# Patient Record
Sex: Female | Born: 1975 | Race: Black or African American | Hispanic: No | Marital: Single | Smoking: Former smoker
Health system: Southern US, Community
[De-identification: ages and names within clinical notes are randomized; demographics above are authoritative.]

## PROBLEM LIST (undated history)

## (undated) DIAGNOSIS — I10 Essential (primary) hypertension: Secondary | ICD-10-CM

## (undated) DIAGNOSIS — G43909 Migraine, unspecified, not intractable, without status migrainosus: Secondary | ICD-10-CM

## (undated) DIAGNOSIS — N979 Female infertility, unspecified: Secondary | ICD-10-CM

## (undated) DIAGNOSIS — E559 Vitamin D deficiency, unspecified: Secondary | ICD-10-CM

## (undated) DIAGNOSIS — F32A Depression, unspecified: Secondary | ICD-10-CM

## (undated) DIAGNOSIS — K760 Fatty (change of) liver, not elsewhere classified: Secondary | ICD-10-CM

## (undated) DIAGNOSIS — F419 Anxiety disorder, unspecified: Secondary | ICD-10-CM

## (undated) DIAGNOSIS — R0602 Shortness of breath: Secondary | ICD-10-CM

## (undated) DIAGNOSIS — M2550 Pain in unspecified joint: Secondary | ICD-10-CM

## (undated) DIAGNOSIS — R0789 Other chest pain: Secondary | ICD-10-CM

## (undated) DIAGNOSIS — F325 Major depressive disorder, single episode, in full remission: Secondary | ICD-10-CM

## (undated) DIAGNOSIS — M7989 Other specified soft tissue disorders: Secondary | ICD-10-CM

## (undated) DIAGNOSIS — Z91018 Allergy to other foods: Secondary | ICD-10-CM

## (undated) DIAGNOSIS — K829 Disease of gallbladder, unspecified: Secondary | ICD-10-CM

## (undated) DIAGNOSIS — M549 Dorsalgia, unspecified: Secondary | ICD-10-CM

## (undated) DIAGNOSIS — E282 Polycystic ovarian syndrome: Secondary | ICD-10-CM

## (undated) HISTORY — DX: Allergy to other foods: Z91.018

## (undated) HISTORY — DX: Disease of gallbladder, unspecified: K82.9

## (undated) HISTORY — DX: Fatty (change of) liver, not elsewhere classified: K76.0

## (undated) HISTORY — DX: Shortness of breath: R06.02

## (undated) HISTORY — DX: Vitamin D deficiency, unspecified: E55.9

## (undated) HISTORY — DX: Other specified soft tissue disorders: M79.89

## (undated) HISTORY — DX: Depression, unspecified: F32.A

## (undated) HISTORY — DX: Pain in unspecified joint: M25.50

## (undated) HISTORY — DX: Other chest pain: R07.89

## (undated) HISTORY — DX: Female infertility, unspecified: N97.9

## (undated) HISTORY — DX: Migraine, unspecified, not intractable, without status migrainosus: G43.909

## (undated) HISTORY — DX: Anxiety disorder, unspecified: F41.9

## (undated) HISTORY — DX: Dorsalgia, unspecified: M54.9

---
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
## 1997-09-13 ENCOUNTER — Encounter: Admission: RE | Admit: 1997-09-13 | Discharge: 1997-09-13 | Payer: Self-pay | Admitting: Family Medicine

## 1997-09-28 ENCOUNTER — Encounter: Admission: RE | Admit: 1997-09-28 | Discharge: 1997-09-28 | Payer: Self-pay | Admitting: Family Medicine

## 1998-03-08 ENCOUNTER — Encounter: Admission: RE | Admit: 1998-03-08 | Discharge: 1998-03-08 | Payer: Self-pay | Admitting: Family Medicine

## 1998-04-02 ENCOUNTER — Encounter: Admission: RE | Admit: 1998-04-02 | Discharge: 1998-04-02 | Payer: Self-pay | Admitting: Family Medicine

## 1998-04-05 ENCOUNTER — Encounter: Admission: RE | Admit: 1998-04-05 | Discharge: 1998-04-05 | Payer: Self-pay | Admitting: Family Medicine

## 1998-04-09 ENCOUNTER — Other Ambulatory Visit: Admission: RE | Admit: 1998-04-09 | Discharge: 1998-04-09 | Payer: Self-pay | Admitting: Family Medicine

## 1998-04-19 ENCOUNTER — Encounter: Admission: RE | Admit: 1998-04-19 | Discharge: 1998-04-19 | Payer: Self-pay | Admitting: Family Medicine

## 1998-07-12 ENCOUNTER — Encounter: Admission: RE | Admit: 1998-07-12 | Discharge: 1998-07-12 | Payer: Self-pay | Admitting: Family Medicine

## 1998-08-30 ENCOUNTER — Encounter: Admission: RE | Admit: 1998-08-30 | Discharge: 1998-08-30 | Payer: Self-pay | Admitting: Family Medicine

## 1998-09-13 ENCOUNTER — Encounter: Admission: RE | Admit: 1998-09-13 | Discharge: 1998-09-13 | Payer: Self-pay | Admitting: Family Medicine

## 1998-10-09 ENCOUNTER — Encounter: Admission: RE | Admit: 1998-10-09 | Discharge: 1998-10-09 | Payer: Self-pay | Admitting: Family Medicine

## 1998-11-07 ENCOUNTER — Encounter: Admission: RE | Admit: 1998-11-07 | Discharge: 1998-11-07 | Payer: Self-pay | Admitting: Family Medicine

## 1999-04-23 ENCOUNTER — Encounter: Admission: RE | Admit: 1999-04-23 | Discharge: 1999-04-23 | Payer: Self-pay | Admitting: Family Medicine

## 1999-05-16 ENCOUNTER — Encounter: Admission: RE | Admit: 1999-05-16 | Discharge: 1999-05-16 | Payer: Self-pay | Admitting: Family Medicine

## 1999-06-27 ENCOUNTER — Encounter: Admission: RE | Admit: 1999-06-27 | Discharge: 1999-06-27 | Payer: Self-pay | Admitting: Family Medicine

## 1999-08-21 ENCOUNTER — Encounter: Admission: RE | Admit: 1999-08-21 | Discharge: 1999-08-21 | Payer: Self-pay | Admitting: Family Medicine

## 1999-10-29 ENCOUNTER — Encounter: Admission: RE | Admit: 1999-10-29 | Discharge: 1999-10-29 | Payer: Self-pay | Admitting: Family Medicine

## 2000-03-11 ENCOUNTER — Encounter: Admission: RE | Admit: 2000-03-11 | Discharge: 2000-03-11 | Payer: Self-pay | Admitting: Family Medicine

## 2000-03-17 ENCOUNTER — Encounter: Admission: RE | Admit: 2000-03-17 | Discharge: 2000-03-17 | Payer: Self-pay | Admitting: Family Medicine

## 2000-04-13 ENCOUNTER — Encounter: Admission: RE | Admit: 2000-04-13 | Discharge: 2000-04-13 | Payer: Self-pay | Admitting: Family Medicine

## 2000-05-19 ENCOUNTER — Encounter: Admission: RE | Admit: 2000-05-19 | Discharge: 2000-05-19 | Payer: Self-pay | Admitting: Family Medicine

## 2000-06-11 ENCOUNTER — Encounter: Admission: RE | Admit: 2000-06-11 | Discharge: 2000-06-11 | Payer: Self-pay | Admitting: Family Medicine

## 2000-06-30 ENCOUNTER — Encounter: Admission: RE | Admit: 2000-06-30 | Discharge: 2000-06-30 | Payer: Self-pay | Admitting: Family Medicine

## 2000-07-13 ENCOUNTER — Encounter: Admission: RE | Admit: 2000-07-13 | Discharge: 2000-07-13 | Payer: Self-pay | Admitting: Family Medicine

## 2000-07-29 ENCOUNTER — Encounter: Admission: RE | Admit: 2000-07-29 | Discharge: 2000-07-29 | Payer: Self-pay | Admitting: Family Medicine

## 2000-11-29 ENCOUNTER — Emergency Department (HOSPITAL_COMMUNITY): Admission: EM | Admit: 2000-11-29 | Discharge: 2000-11-29 | Payer: Self-pay

## 2000-11-29 ENCOUNTER — Encounter: Payer: Self-pay | Admitting: Emergency Medicine

## 2001-01-31 ENCOUNTER — Encounter: Admission: RE | Admit: 2001-01-31 | Discharge: 2001-01-31 | Payer: Self-pay | Admitting: Family Medicine

## 2001-03-02 ENCOUNTER — Encounter: Admission: RE | Admit: 2001-03-02 | Discharge: 2001-03-02 | Payer: Self-pay | Admitting: Family Medicine

## 2001-03-17 ENCOUNTER — Ambulatory Visit (HOSPITAL_BASED_OUTPATIENT_CLINIC_OR_DEPARTMENT_OTHER): Admission: RE | Admit: 2001-03-17 | Discharge: 2001-03-17 | Payer: Self-pay | Admitting: Family Medicine

## 2001-03-30 ENCOUNTER — Encounter: Admission: RE | Admit: 2001-03-30 | Discharge: 2001-03-30 | Payer: Self-pay | Admitting: Family Medicine

## 2001-03-30 ENCOUNTER — Encounter (INDEPENDENT_AMBULATORY_CARE_PROVIDER_SITE_OTHER): Payer: Self-pay | Admitting: *Deleted

## 2001-04-13 ENCOUNTER — Encounter: Admission: RE | Admit: 2001-04-13 | Discharge: 2001-04-13 | Payer: Self-pay | Admitting: Family Medicine

## 2001-04-29 ENCOUNTER — Encounter: Admission: RE | Admit: 2001-04-29 | Discharge: 2001-04-29 | Payer: Self-pay | Admitting: Family Medicine

## 2001-05-18 ENCOUNTER — Encounter: Admission: RE | Admit: 2001-05-18 | Discharge: 2001-05-18 | Payer: Self-pay | Admitting: Family Medicine

## 2001-05-31 ENCOUNTER — Encounter: Admission: RE | Admit: 2001-05-31 | Discharge: 2001-05-31 | Payer: Self-pay | Admitting: Family Medicine

## 2001-05-31 ENCOUNTER — Encounter: Payer: Self-pay | Admitting: Sports Medicine

## 2001-05-31 ENCOUNTER — Encounter: Admission: RE | Admit: 2001-05-31 | Discharge: 2001-05-31 | Payer: Self-pay | Admitting: Sports Medicine

## 2001-06-07 ENCOUNTER — Encounter: Admission: RE | Admit: 2001-06-07 | Discharge: 2001-06-07 | Payer: Self-pay | Admitting: Family Medicine

## 2001-06-08 ENCOUNTER — Encounter: Admission: RE | Admit: 2001-06-08 | Discharge: 2001-06-08 | Payer: Self-pay | Admitting: Family Medicine

## 2001-06-14 ENCOUNTER — Encounter: Admission: RE | Admit: 2001-06-14 | Discharge: 2001-06-14 | Payer: Self-pay | Admitting: Family Medicine

## 2001-06-22 ENCOUNTER — Encounter: Admission: RE | Admit: 2001-06-22 | Discharge: 2001-06-22 | Payer: Self-pay | Admitting: Family Medicine

## 2001-06-27 ENCOUNTER — Encounter: Admission: RE | Admit: 2001-06-27 | Discharge: 2001-07-22 | Payer: Self-pay | Admitting: Sports Medicine

## 2001-07-06 ENCOUNTER — Encounter: Admission: RE | Admit: 2001-07-06 | Discharge: 2001-07-06 | Payer: Self-pay | Admitting: Family Medicine

## 2001-12-05 ENCOUNTER — Encounter: Admission: RE | Admit: 2001-12-05 | Discharge: 2001-12-05 | Payer: Self-pay | Admitting: Family Medicine

## 2003-02-05 ENCOUNTER — Encounter: Admission: RE | Admit: 2003-02-05 | Discharge: 2003-02-05 | Payer: Self-pay | Admitting: Family Medicine

## 2003-02-21 ENCOUNTER — Encounter: Admission: RE | Admit: 2003-02-21 | Discharge: 2003-02-21 | Payer: Self-pay | Admitting: Family Medicine

## 2003-02-21 ENCOUNTER — Encounter (INDEPENDENT_AMBULATORY_CARE_PROVIDER_SITE_OTHER): Payer: Self-pay | Admitting: Family Medicine

## 2003-03-14 ENCOUNTER — Encounter: Admission: RE | Admit: 2003-03-14 | Discharge: 2003-03-14 | Payer: Self-pay | Admitting: Family Medicine

## 2004-05-10 ENCOUNTER — Encounter (INDEPENDENT_AMBULATORY_CARE_PROVIDER_SITE_OTHER): Payer: Self-pay | Admitting: *Deleted

## 2004-05-10 LAB — CONVERTED CEMR LAB

## 2004-05-14 ENCOUNTER — Ambulatory Visit: Payer: Self-pay | Admitting: Family Medicine

## 2004-05-14 ENCOUNTER — Encounter (INDEPENDENT_AMBULATORY_CARE_PROVIDER_SITE_OTHER): Payer: Self-pay | Admitting: Family Medicine

## 2005-01-05 HISTORY — PX: CHOLECYSTECTOMY: SHX55

## 2005-09-06 ENCOUNTER — Emergency Department (HOSPITAL_COMMUNITY): Admission: EM | Admit: 2005-09-06 | Discharge: 2005-09-07 | Payer: Self-pay | Admitting: Emergency Medicine

## 2005-09-16 ENCOUNTER — Encounter: Admission: RE | Admit: 2005-09-16 | Discharge: 2005-09-16 | Payer: Self-pay | Admitting: Sports Medicine

## 2005-09-24 ENCOUNTER — Emergency Department (HOSPITAL_COMMUNITY): Admission: EM | Admit: 2005-09-24 | Discharge: 2005-09-24 | Payer: Self-pay | Admitting: Emergency Medicine

## 2005-09-30 ENCOUNTER — Encounter (INDEPENDENT_AMBULATORY_CARE_PROVIDER_SITE_OTHER): Payer: Self-pay | Admitting: Specialist

## 2005-09-30 ENCOUNTER — Ambulatory Visit (HOSPITAL_COMMUNITY): Admission: RE | Admit: 2005-09-30 | Discharge: 2005-10-01 | Payer: Self-pay | Admitting: General Surgery

## 2006-03-04 DIAGNOSIS — E669 Obesity, unspecified: Secondary | ICD-10-CM | POA: Insufficient documentation

## 2006-03-04 DIAGNOSIS — F323 Major depressive disorder, single episode, severe with psychotic features: Secondary | ICD-10-CM

## 2006-03-04 DIAGNOSIS — E282 Polycystic ovarian syndrome: Secondary | ICD-10-CM | POA: Insufficient documentation

## 2006-03-04 DIAGNOSIS — L680 Hirsutism: Secondary | ICD-10-CM

## 2006-03-04 DIAGNOSIS — G5600 Carpal tunnel syndrome, unspecified upper limb: Secondary | ICD-10-CM | POA: Insufficient documentation

## 2006-03-05 ENCOUNTER — Encounter (INDEPENDENT_AMBULATORY_CARE_PROVIDER_SITE_OTHER): Payer: Self-pay | Admitting: *Deleted

## 2006-03-26 ENCOUNTER — Ambulatory Visit: Payer: Self-pay | Admitting: Family Medicine

## 2006-04-12 ENCOUNTER — Telehealth (INDEPENDENT_AMBULATORY_CARE_PROVIDER_SITE_OTHER): Payer: Self-pay | Admitting: Family Medicine

## 2006-04-14 ENCOUNTER — Telehealth: Payer: Self-pay | Admitting: *Deleted

## 2006-04-29 ENCOUNTER — Telehealth (INDEPENDENT_AMBULATORY_CARE_PROVIDER_SITE_OTHER): Payer: Self-pay | Admitting: *Deleted

## 2006-05-05 ENCOUNTER — Ambulatory Visit: Payer: Self-pay | Admitting: Family Medicine

## 2006-06-18 ENCOUNTER — Telehealth: Payer: Self-pay | Admitting: *Deleted

## 2006-09-29 ENCOUNTER — Telehealth: Payer: Self-pay | Admitting: *Deleted

## 2006-09-29 ENCOUNTER — Encounter: Payer: Self-pay | Admitting: Family Medicine

## 2006-09-29 ENCOUNTER — Ambulatory Visit: Payer: Self-pay | Admitting: Family Medicine

## 2006-09-29 LAB — CONVERTED CEMR LAB
Calcium: 9.4 mg/dL (ref 8.4–10.5)
Glucose, Bld: 106 mg/dL — ABNORMAL HIGH (ref 70–99)
Sodium: 139 meq/L (ref 135–145)
TSH: 1.719 microintl units/mL (ref 0.350–5.50)

## 2006-10-05 ENCOUNTER — Telehealth: Payer: Self-pay | Admitting: Family Medicine

## 2006-10-07 ENCOUNTER — Telehealth: Payer: Self-pay | Admitting: *Deleted

## 2006-10-11 ENCOUNTER — Other Ambulatory Visit (HOSPITAL_COMMUNITY): Admission: RE | Admit: 2006-10-11 | Discharge: 2006-10-22 | Payer: Self-pay | Admitting: Psychiatry

## 2006-10-26 ENCOUNTER — Encounter: Payer: Self-pay | Admitting: Family Medicine

## 2006-10-27 ENCOUNTER — Ambulatory Visit: Payer: Self-pay | Admitting: Psychiatry

## 2006-11-24 ENCOUNTER — Other Ambulatory Visit (HOSPITAL_COMMUNITY): Admission: RE | Admit: 2006-11-24 | Discharge: 2006-12-08 | Payer: Self-pay | Admitting: Psychiatry

## 2006-12-08 ENCOUNTER — Ambulatory Visit: Payer: Self-pay | Admitting: *Deleted

## 2006-12-08 ENCOUNTER — Inpatient Hospital Stay (HOSPITAL_COMMUNITY): Admission: RE | Admit: 2006-12-08 | Discharge: 2006-12-15 | Payer: Self-pay | Admitting: *Deleted

## 2006-12-16 ENCOUNTER — Other Ambulatory Visit (HOSPITAL_COMMUNITY): Admission: RE | Admit: 2006-12-16 | Discharge: 2007-01-10 | Payer: Self-pay | Admitting: Psychiatry

## 2006-12-17 ENCOUNTER — Telehealth: Payer: Self-pay | Admitting: *Deleted

## 2006-12-21 ENCOUNTER — Ambulatory Visit: Payer: Self-pay | Admitting: Family Medicine

## 2006-12-21 ENCOUNTER — Telehealth: Payer: Self-pay | Admitting: *Deleted

## 2006-12-21 ENCOUNTER — Encounter: Admission: RE | Admit: 2006-12-21 | Discharge: 2006-12-21 | Payer: Self-pay | Admitting: Family Medicine

## 2007-01-11 ENCOUNTER — Encounter: Payer: Self-pay | Admitting: Family Medicine

## 2007-01-15 ENCOUNTER — Emergency Department (HOSPITAL_COMMUNITY): Admission: EM | Admit: 2007-01-15 | Discharge: 2007-01-15 | Payer: Self-pay | Admitting: Emergency Medicine

## 2007-01-15 ENCOUNTER — Inpatient Hospital Stay (HOSPITAL_COMMUNITY): Admission: AD | Admit: 2007-01-15 | Discharge: 2007-01-19 | Payer: Self-pay | Admitting: Psychiatry

## 2007-01-17 ENCOUNTER — Ambulatory Visit: Payer: Self-pay | Admitting: Psychiatry

## 2007-01-20 ENCOUNTER — Other Ambulatory Visit (HOSPITAL_COMMUNITY): Admission: RE | Admit: 2007-01-20 | Discharge: 2007-04-20 | Payer: Self-pay | Admitting: Psychiatry

## 2007-03-27 ENCOUNTER — Emergency Department (HOSPITAL_COMMUNITY): Admission: EM | Admit: 2007-03-27 | Discharge: 2007-03-27 | Payer: Self-pay | Admitting: Emergency Medicine

## 2007-03-28 ENCOUNTER — Telehealth: Payer: Self-pay | Admitting: *Deleted

## 2007-03-31 ENCOUNTER — Encounter (INDEPENDENT_AMBULATORY_CARE_PROVIDER_SITE_OTHER): Payer: Self-pay | Admitting: Family Medicine

## 2007-03-31 ENCOUNTER — Ambulatory Visit: Payer: Self-pay | Admitting: Family Medicine

## 2007-03-31 DIAGNOSIS — G4733 Obstructive sleep apnea (adult) (pediatric): Secondary | ICD-10-CM | POA: Insufficient documentation

## 2007-03-31 DIAGNOSIS — I10 Essential (primary) hypertension: Secondary | ICD-10-CM

## 2007-03-31 LAB — CONVERTED CEMR LAB
ALT: 24 units/L (ref 0–35)
AST: 21 units/L (ref 0–37)
Albumin: 4.1 g/dL (ref 3.5–5.2)
Alkaline Phosphatase: 65 units/L (ref 39–117)
Glucose, Bld: 80 mg/dL (ref 70–99)
MCHC: 32 g/dL (ref 30.0–36.0)
Platelets: 398 10*3/uL (ref 150–400)
Potassium: 4 meq/L (ref 3.5–5.3)
RBC: 4.61 M/uL (ref 3.87–5.11)
Sodium: 138 meq/L (ref 135–145)
Total Protein: 7.5 g/dL (ref 6.0–8.3)

## 2007-04-28 ENCOUNTER — Ambulatory Visit: Payer: Self-pay | Admitting: Family Medicine

## 2007-05-13 ENCOUNTER — Ambulatory Visit: Payer: Self-pay | Admitting: Family Medicine

## 2007-05-13 ENCOUNTER — Telehealth: Payer: Self-pay | Admitting: *Deleted

## 2007-08-09 ENCOUNTER — Encounter: Payer: Self-pay | Admitting: Family Medicine

## 2007-08-31 ENCOUNTER — Encounter: Admission: RE | Admit: 2007-08-31 | Discharge: 2007-11-29 | Payer: Self-pay | Admitting: Endocrinology

## 2007-09-06 ENCOUNTER — Telehealth: Payer: Self-pay | Admitting: *Deleted

## 2008-03-16 ENCOUNTER — Ambulatory Visit: Payer: Self-pay | Admitting: Family Medicine

## 2008-03-16 ENCOUNTER — Telehealth: Payer: Self-pay | Admitting: Family Medicine

## 2008-09-22 ENCOUNTER — Encounter (INDEPENDENT_AMBULATORY_CARE_PROVIDER_SITE_OTHER): Payer: Self-pay | Admitting: *Deleted

## 2008-11-30 IMAGING — CT CT ANGIO CHEST
2 of 5 series · 19 of 36 positions shown · IV contrast (omnipaque)
Comparison: none

CLINICAL DATA: Chest pain and shortness of breath.  Elevated D-dimer.  Morbid obesity and lower extremity swelling.  
 CT ANGIOGRAPHY OF CHEST:
TECHNIQUE: Multidetector CT imaging of the chest was performed during bolus injection of intravenous contrast.  Multiplanar CT angiographic image reconstructions were generated to evaluate the vascular anatomy.
 Contrast:  100 cc Omnipaque 350

[Series 9: pulm embolism 1.0 b25f thins · axial · 0.72mm/px · z∈[-244,-60]mm · 16 of 206 slices shown]
[im 11/206  lung]
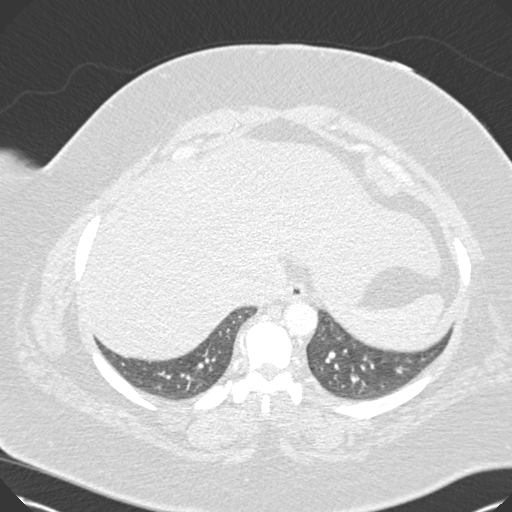
[im 22/206  mediastinal]
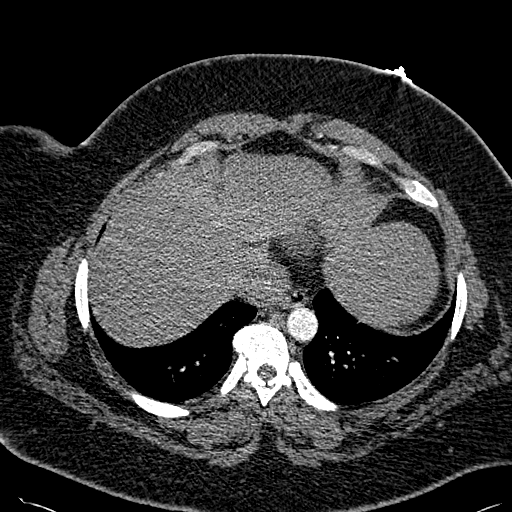
[im 33/206  lung]
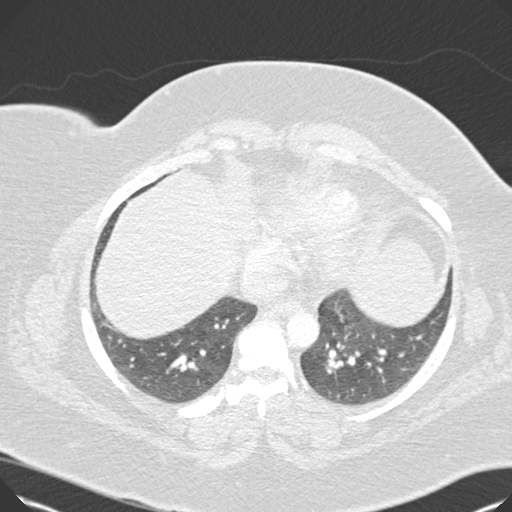
[im 44/206  mediastinal]
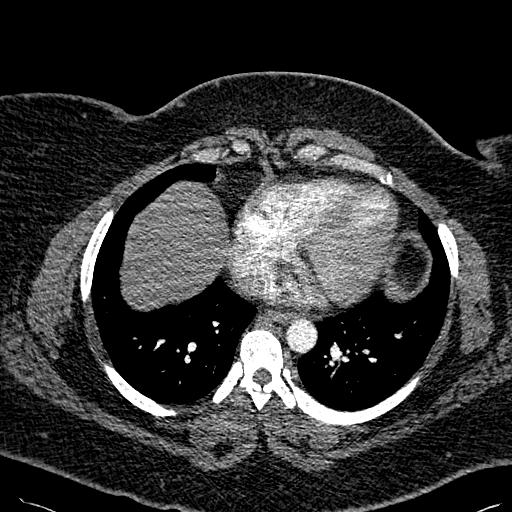
[im 65/206  lung]
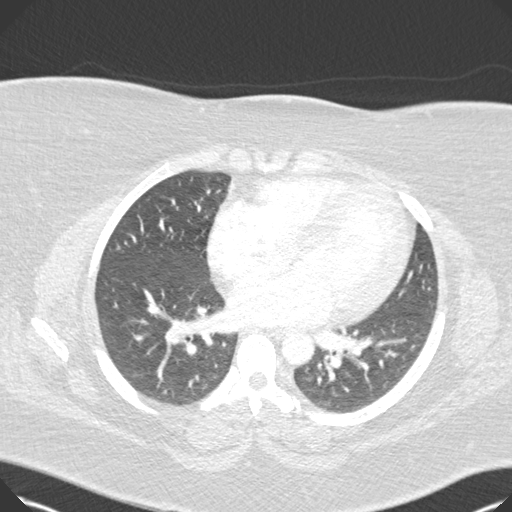
[im 76/206  mediastinal]
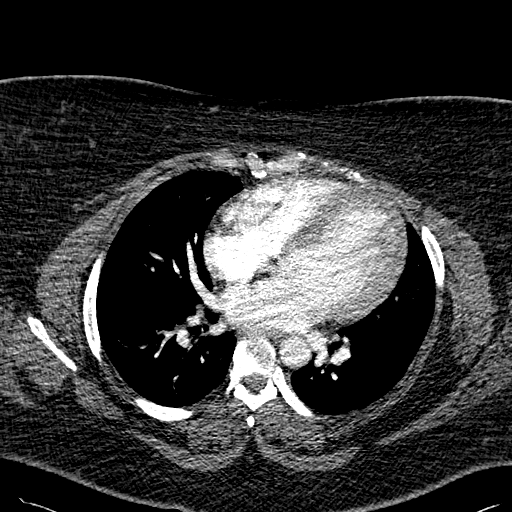
[im 87/206  lung]
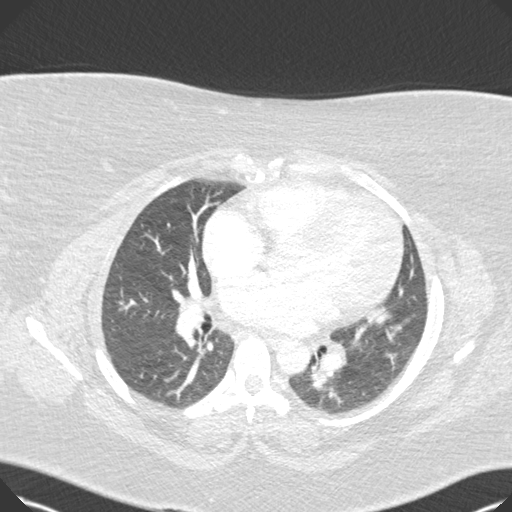
[im 98/206  mediastinal]
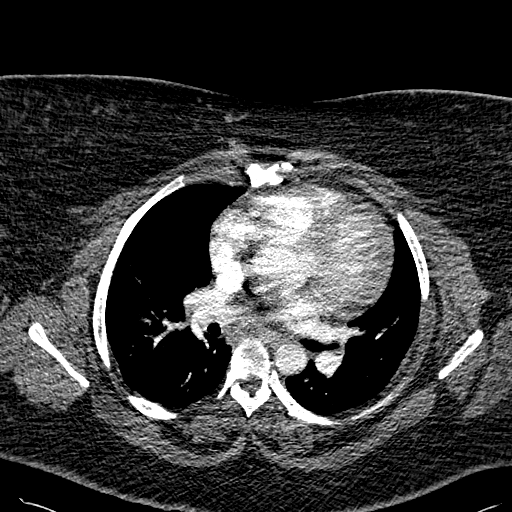
[im 108/206  lung]
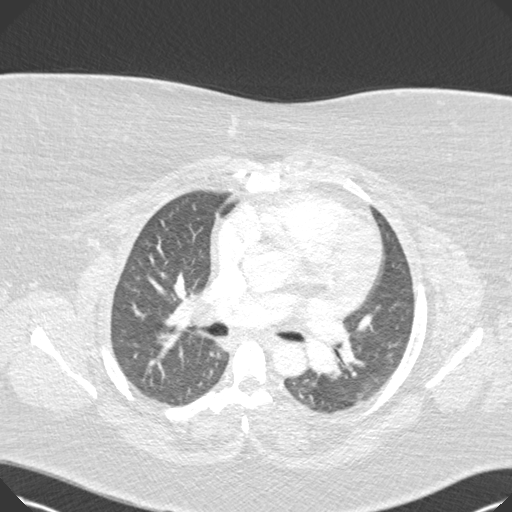
[im 119/206  mediastinal]
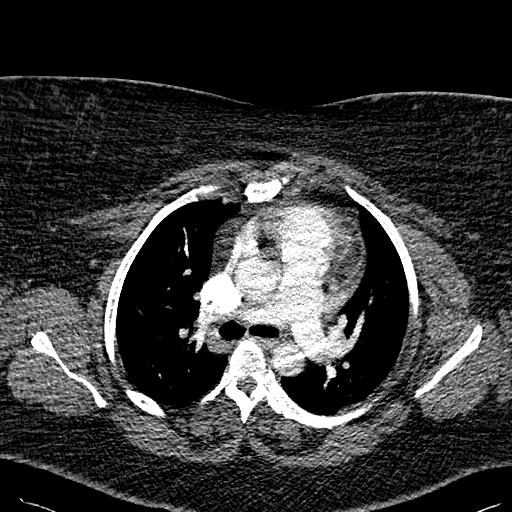
[im 130/206  lung]
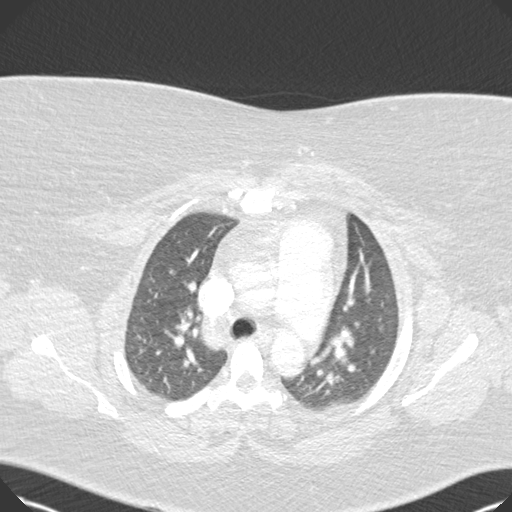
[im 141/206  mediastinal]
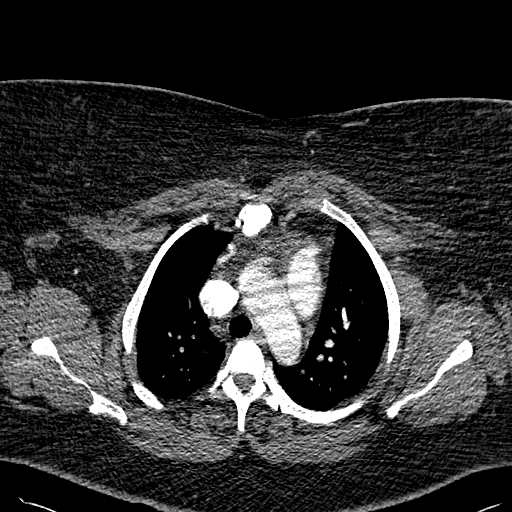
[im 162/206  lung]
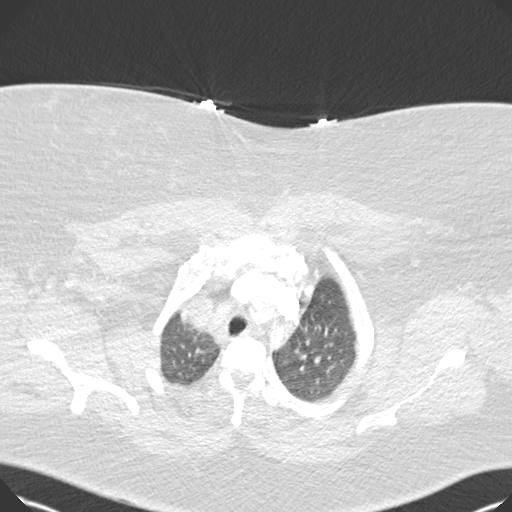
[im 173/206  mediastinal]
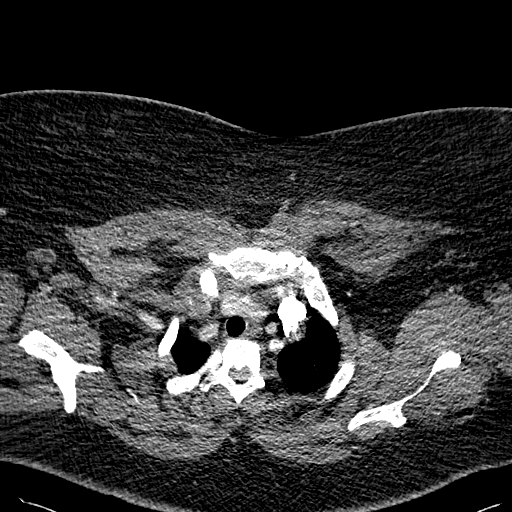
[im 184/206  lung]
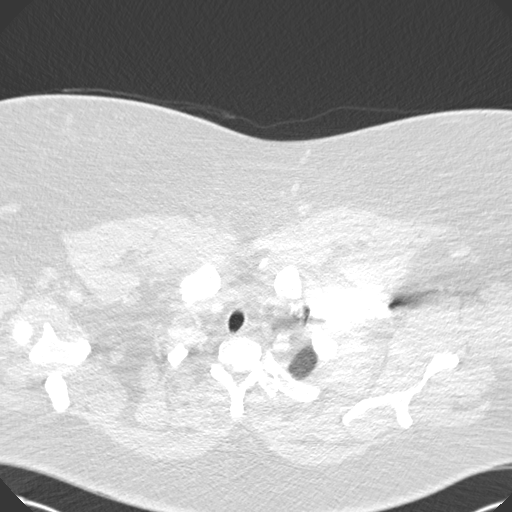
[im 195/206  mediastinal]
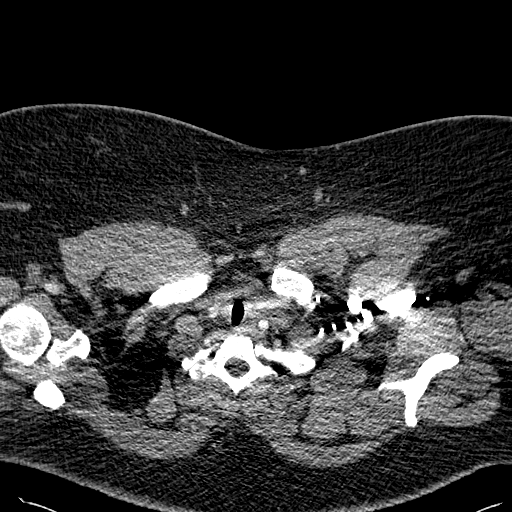

[Series 602: cor chest · coronal · 0.72mm/px · 3 of 103 slices shown]
[im 21/103  mediastinal]
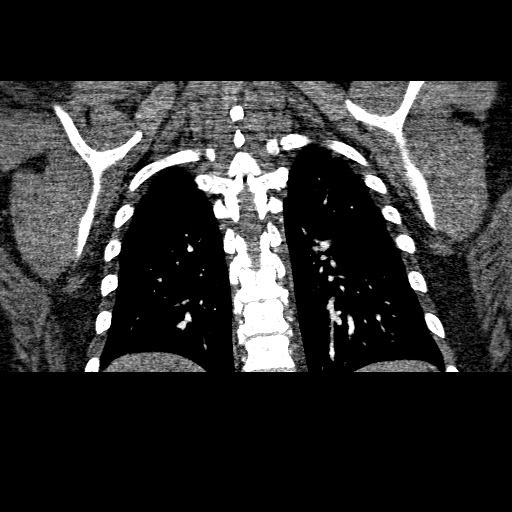
[im 41/103  mediastinal]
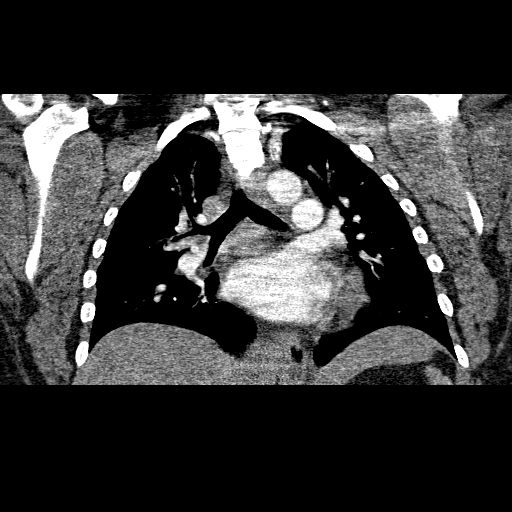
[im 62/103  mediastinal]
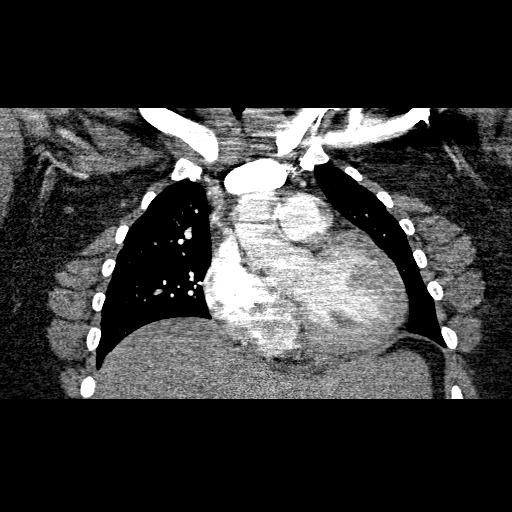

[19 of 36 positions shown; findings below may reference images not displayed]

FINDINGS: Satisfactory opacification of the pulmonary arteries is seen, and there is no evidence of acute pulmonary embolism.  Beam hardening artifact is noted related to patient habitus.  There is no evidence of thoracic aortic aneurysm or dissection.  There is no evidence of mediastinal or hilar masses.  
 There is no evidence of pleural or pericardial effusion.  Both lungs are clear and there is no evidence of pulmonary infiltrate or mass.
IMPRESSION: No evidence of acute pulmonary embolism or other active disease within the thorax.

## 2008-12-14 ENCOUNTER — Emergency Department (HOSPITAL_COMMUNITY): Admission: EM | Admit: 2008-12-14 | Discharge: 2008-12-15 | Payer: Self-pay | Admitting: Emergency Medicine

## 2009-01-16 ENCOUNTER — Ambulatory Visit: Payer: Self-pay | Admitting: Family Medicine

## 2009-01-16 DIAGNOSIS — E1149 Type 2 diabetes mellitus with other diabetic neurological complication: Secondary | ICD-10-CM | POA: Insufficient documentation

## 2009-01-16 DIAGNOSIS — E119 Type 2 diabetes mellitus without complications: Secondary | ICD-10-CM

## 2009-02-01 ENCOUNTER — Telehealth: Payer: Self-pay | Admitting: Family Medicine

## 2009-02-01 ENCOUNTER — Ambulatory Visit: Payer: Self-pay | Admitting: Family Medicine

## 2009-06-21 ENCOUNTER — Encounter: Payer: Self-pay | Admitting: Family Medicine

## 2009-11-15 ENCOUNTER — Encounter: Payer: Self-pay | Admitting: Family Medicine

## 2009-11-24 ENCOUNTER — Telehealth: Payer: Self-pay | Admitting: Family Medicine

## 2009-11-24 ENCOUNTER — Emergency Department (HOSPITAL_COMMUNITY): Admission: EM | Admit: 2009-11-24 | Discharge: 2009-11-24 | Payer: Self-pay | Admitting: Family Medicine

## 2010-01-30 ENCOUNTER — Ambulatory Visit: Admission: RE | Admit: 2010-01-30 | Discharge: 2010-01-30 | Payer: Self-pay | Source: Home / Self Care

## 2010-02-04 NOTE — Miscellaneous (Signed)
  Clinical Lists Changes  Problems: Removed problem of CHEST PAIN, ATYPICAL (ICD-786.59) Removed problem of DRY SKIN (ICD-701.1) Removed problem of GALACTORRHEA, NON-OBSTETRIC (ICD-611.6) Removed problem of AMENORRHEA (ICD-626.0) Removed problem of HAND PAIN (ICD-729.5) Medications: Removed medication of LISINOPRIL 10 MG TABS (LISINOPRIL) one tab by mouth daily Removed medication of PRILOSEC 20 MG CPDR (OMEPRAZOLE) 1 tablet by mouth 30 minutes before your first meal of the day

## 2010-02-04 NOTE — Assessment & Plan Note (Signed)
Summary: ringworm?   Vital Signs:  Patient profile:   35 year old female Height:      64.5 inches Weight:      335.6 pounds BMI:     56.92 Temp:     98.2 degrees F oral Pulse rate:   86 / minute BP sitting:   162 / 97 Cuff size:   large  Vitals Entered By: Gladstone Pih (January 16, 2009 1:51 PM) CC: ?ringworm, elevated BP Is Patient Diabetic? Yes Did you bring your meter with you today? No Pain Assessment Patient in pain? no        Primary Care Provider:  Lequita Asal  MD  CC:  ?ringworm and elevated BP.  History of Present Illness: 35 y/o female lost to follow up for almost 2 years who presents with:  ?ringworm- 2 daughters diagnosed with tinea capitis after christmas. patient developed scaly lesion at border of scalp and forehead on left side. some pruritus initially but that resolved. she had not tried anything for lesion.   BP- h/o HTN. currently not taking any medication. states HCTZ previously stopped by Psych due to antipsychotic medications. denies chest pain, SOB, headache, palpitations, peripheral edema.   DM- diagnosed last year by gynecologist. reports last A1C was "4-something." denies polyuria, polydipsia. was on glucophage, but that was stopped.   Habits & Providers  Alcohol-Tobacco-Diet     Tobacco Status: quit     Tobacco Counseling: to remain off tobacco products     Year Quit: 2010  Current Medications (verified): 1)  Geodon 60 Mg Caps (Ziprasidone Hcl) 2)  Cymbalta 30 Mg Cpep (Duloxetine Hcl)  Allergies (verified): No Known Drug Allergies  Social History: Smoking Status:  quit  Physical Exam  General:  morbidly obese, hirsute female ,in no acute distress; alert,appropriate and cooperative throughout examination. vitals reviewed.  Mouth:  MMM Lungs:  Normal respiratory effort, chest expands symmetrically. Lungs are clear to auscultation, no crackles or wheezes. Heart:  Normal rate and regular rhythm. S1 and S2 normal without gallop,  murmur, click, rub or other extra sounds. Extremities:  No edema of BLE Skin:  hirsute scaling lesion anterior scalp/forehead on left. not really annular. a lot of flaking with scraping for KOH prep.  Psych:  Oriented X3 and normally interactive.     Impression & Recommendations:  Problem # 1:  DRY SKIN (ICD-701.1) Assessment New  KOH prep negative. Discussed with Dr. Jennette Kettle. recommend topical OTC hydrocortisone for 1 week. if no improvement, will treat for tinea capitis.   Orders: [facility]- Est  Level 4 ([address])  Problem # 2:  DIABETES MELLITUS, TYPE II, CONTROLLED (ICD-250.00) Assessment: New  followed by an endocrinologist. not on meds.   The following medications were removed from the medication list:    Lisinopril-hydrochlorothiazide 10-12.5 Mg Tabs (Lisinopril-hydrochlorothiazide) ..... One tab by mouth daily Her updated medication list for this problem includes:    Lisinopril 10 Mg Tabs (Lisinopril) ..... One tab by mouth daily  Orders: [facility]- Est  Level 4 ([address])  Problem # 3:  HYPERTENSION, ESSENTIAL NOS (ICD-401.9) Assessment: Deteriorated will restart lisinopril 10 mg by mouth daily. titrate up as necessary. check BMP at next visit.   The following medications were removed from the medication list:    Lisinopril-hydrochlorothiazide 10-12.5 Mg Tabs (Lisinopril-hydrochlorothiazide) ..... One tab by mouth daily Her updated medication list for this problem includes:    Lisinopril 10 Mg Tabs (Lisinopril) ..... One tab by mouth daily  Other Orders: [facility] ([address])  Patient Instructions: 1)  call next week if area on your head not better 2)  follow up in 1 month about blood pressure.  Prescriptions: LISINOPRIL 10 MG TABS (LISINOPRIL) one tab by mouth daily  #30 x 1   Entered and Authorized by:   Lequita Asal  MD   Signed by:   Lequita Asal  MD on 01/16/2009   Method used:   Electronically to        [facility]. [phone]* (retail)       [address]       [place], [place]  [address]       Ph: [phone]       Fax: [phone]   RxID:   [id number]   Laboratory Results  Date/Time Received: January 16, 2009 2:10 PM  Date/Time Reported: January 16, 2009 2:20 PM   Other Tests  Skin KOH: Negative Comments: ...........test performed by...........Marland KitchenTerese Door, CMA
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
## 2010-02-04 NOTE — Miscellaneous (Signed)
Summary: problem list  Clinical Lists Changes  Problems: Removed problem of TOBACCO USE, QUIT (ICD-V15.82) Removed problem of HYPERTENSION, ESSENTIAL NOS (ICD-401.9)

## 2010-02-04 NOTE — Assessment & Plan Note (Signed)
Summary: atypical cp -> reflux; uncontrolled HTN   Vital Signs:  Patient profile:   35 year old female Height:      64.5 inches Weight:      331.7 pounds BMI:     56.26 Temp:     100.0 degrees F oral Pulse rate:   128 / minute BP sitting:   153 / 102  (left arm) Cuff size:   large  Vitals Entered By: Gladstone Pih (February 01, 2009 3:33 PM)  Serial Vital Signs/Assessments:  Time      Position  BP       Pulse  Resp  Temp     By 4:00 PM             160/108                        Marisue Ivan  MD  Comments: 4:00 PM L arm, sitting, manual By: Marisue Ivan  MD   CC: chest pain last night Is Patient Diabetic? Yes Did you bring your meter with you today? No Comments  preasure in chest last night, back is just sore now, denies chest pain now, has Rx for HTN but has not started it, unable to get Rx filled   Primary Care Provider:  Lequita Asal  MD  CC:  chest pain last night.  History of Present Illness: 35yo F s/p chest discomfort  Chest discomfort: 1 episode last night while at rest, watching a movie.  It lasted 10-15 minutes.  It felt like pressure, on the left side of the chest, nonradiating.  No N/V or diaphoresis.  No paresthesia or numbness associated.  Endorses SOB during the episode and spontaneously resolved.  Pt states that she was eating a hot dog when the symptoms presented.  She has had a history of reflux and thinks that this could be the same symptoms.  HTN: Has not picked up the Lisinopril for financial reasons.    Current Medications (verified): 1)  Lisinopril 10 Mg Tabs (Lisinopril) .... One Tab By Mouth Daily  Allergies (verified): No Known Drug Allergies  Review of Systems        No N/V or diaphoresis.  No paresthesia or numbness associated.  Endorses SOB during the episode and spontaneously resolved.  Physical Exam  General:  VS reviewed and rechecked.  Morbidly obese, hirsutism, NAD Neck:  supple, full ROM Chest Wall:  nontender to  palpation Lungs:  Normal respiratory effort, chest expands symmetrically. Lungs are clear to auscultation, no crackles or wheezes. Heart:  tachy, reg rhythm, no murmurs Abdomen:  obese, soft, NT, ND, no HSM Skin:  nl color and turgor   Impression & Recommendations:  Problem # 1:  CHEST PAIN, ATYPICAL (ICD-786.59) Assessment New  Likely acute reflux symptoms.  Given her history of prior reflux and the scenario in which her symptoms presented, I think it is most likely reflux.  Low likelihood of cardiac etiology.  Will start on prilosec for 2 weeks and reassess with Dr. Lanier Prude.  Orders: [facility]- Est  Level 4 ([address])  Problem # 2:  ESSENTIAL HYPERTENSION, BENIGN (ICD-401.1) Assessment: Unchanged Pt has not picked up her Lisinopril.  Uncontrolled today.  Provided prescription (handwritten) for lisinopril 10mg  #30 x0 to see if she can tolerate it.  She is to f/u with Dr. Lanier Prude in 2 weeks to reassess and monitor Cr.  Her updated medication list for this problem includes:    Lisinopril 10 Mg Tabs (Lisinopril) .Marland KitchenMarland KitchenMarland KitchenMarland Kitchen  One tab by mouth daily  Complete Medication List: 1)  Lisinopril 10 Mg Tabs (Lisinopril) .... One tab by mouth daily 2)  Prilosec 20 Mg Cpdr (Omeprazole) .Marland Kitchen.. 1 tablet by mouth 30 minutes before your first meal of the day  Patient Instructions: 1)  Follow up with Dr. Lanier Prude in 2 weeks after you start the lisinopril and the prilosec. 2)  I don't think this was your heart and it sounds more like heartburn. Prescriptions: LISINOPRIL 10 MG TABS (LISINOPRIL) one tab by mouth daily  #30 x 0   Entered and Authorized by:   Marisue Ivan  MD   Signed by:   Marisue Ivan  MD on 02/01/2009   Method used:   Handwritten   RxID:   [id number]      Prevention & Chronic Care Immunizations   Influenza vaccine: Not documented    Tetanus booster: 01/06/1996: Done.    Pneumococcal vaccine: Not documented  Other Screening   Pap smear: Done.  (05/10/2004)   Smoking  status: quit  (01/16/2009)  Diabetes Mellitus   HgbA1C: Not documented    Eye exam: Not documented    Foot exam: Not documented   High risk foot: Not documented   Foot care education: Not documented    Urine microalbumin/creatinine ratio: Not documented  Hypertension   Last Blood Pressure: 153 / 102  (02/01/2009)   Serum creatinine: 0.72  (03/31/2007)   Serum potassium 4.0  (03/31/2007)    Hypertension flowsheet reviewed?: Yes   Progress toward BP goal: Unchanged  Self-Management Support :   Personal Goals (by the next clinic visit) :      Personal blood pressure goal: 140/90  (02/01/2009)   Diabetes self-management support: Not documented    Hypertension self-management support: Not documented

## 2010-02-04 NOTE — Progress Notes (Signed)
Summary: triage  Phone Note Call from Patient Call back at Home Phone [phone]   Caller: Patient Summary of Call: had a lot of n/v this am with pressure in her back - needs to talk to nurse Initial call taken by: De Nurse,  February 01, 2009 9:16 AM  Follow-up for Phone Call        C/O preasure in back then N/V started early am but has stopped now, had history of this before until they removed gallbadder,  then they stopped, stated she had chest pain last night around 930 pm  for about 10 min and it  took breath away.  All pain is gone and feeling fine right now. would like to see dr this pm,  apt this afternoon with Dr L. Advised to call back if needed or to go to ED  if chest pain returns, voiced understanding Follow-up by: Gladstone Pih,  February 01, 2009 9:46 AM

## 2010-02-06 NOTE — Assessment & Plan Note (Signed)
Summary: MMR/eo  Nurse Visit   Vital Signs:  Patient profile:   35 year old female Temp:     98.1 degrees F  Vitals Entered By: Theresia Lo RN (January 30, 2010 10:39 AM)  Allergies: No Known Drug Allergies  Immunizations Administered:  MMR Vaccine # 1:    Vaccine Type: MMR    Site: right arm    Mfr: Merck    Dose: 0.5 ml    Route: Mar-Mac    Given by: Theresia Lo RN    Exp. Date: 07/17/2011    Lot #: [id number]    VIS given: 03/18/06 version given January 30, 2010.  Orders Added: 1)  MMR Vaccine SQ [90707] 2)  Admin 1st Vaccine [[address]109]

## 2010-04-08 LAB — URINALYSIS, ROUTINE W REFLEX MICROSCOPIC
Bilirubin Urine: NEGATIVE
Glucose, UA: NEGATIVE mg/dL
Ketones, ur: NEGATIVE mg/dL
Protein, ur: NEGATIVE mg/dL

## 2010-04-08 LAB — URINE MICROSCOPIC-ADD ON

## 2010-04-08 LAB — PREGNANCY, URINE
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
## 2010-05-20 NOTE — Discharge Summary (Signed)
NAMEHERMIONE, Lopez               ACCOUNT NO.:  [id number]   MEDICAL RECORD NO.:  [id number]          PATIENT TYPE:  IPS   LOCATION:  0600                          FACILITY:  [facility]   PHYSICIAN:  Anselm Jungling, MD  DATE OF BIRTH:  29-Aug-1975   DATE OF ADMISSION:  12/08/2006  DATE OF DISCHARGE:  12/15/2006                               DISCHARGE SUMMARY   IDENTIFYING DATA AND REASON FOR ADMISSION:  This was an inpatient  psychiatric admission for Natasha Lopez, a 35 year old [place] American female  referred via our intensive outpatient program, where she had been  attending.  She was admitted due to increasing anxiety, auditory and  visual hallucinations.  She reported that she had been having such  hallucinations for least 5 years, on and off, but had never told anyone  about it until the day prior, when she revealed this to an IOP  counselor.  Please refer to the admission note for further details  pertaining to the symptoms, circumstances and history that led to her  hospitalization.  She was given an initial Axis I diagnosis of mood  disorder NOS, rule out bipolar disorder NOS, and rule out stress-induced  hallucinosis.   MEDICAL AND LABORATORY:  The patient was medically and physically  assessed by the psychiatric nurse practitioner.  She was in good health  without any active or chronic medical problems.   HOSPITAL COURSE:  The patient was admitted to the adult inpatient  psychiatric service.  She presented as an overweight, moderately hirsute  woman.  (The patient has polycystic ovarian syndrome), who was alert,  fully oriented, and pleasant but very sad.  Thoughts and speech were  clear without delusions, and there were no signs or symptoms of  psychosis or thought disorder, and the patient denied auditory and  visual hallucinations on the day of admission.  Nonetheless, the patient  felt that being in the inpatient unit was what I need, I think.   The patient was started on a  trial of low-dose Risperdal to address her  level of anxiety and hallucinations.  Risperdal was discontinued in  favor of a trial of Abilify, which appeared to be better tolerated.  In  addition, Seroquel in  low doses was utilized at bedtime to address  needs for sleep.   On the fifth hospital day there was a family session involving the  patient and her mother.  The patient revealed the existence of a  hallucinosis, for some years, which she had never told her mother about  either.  Mother was accepting and concerned about this.  It was a  successful meeting.   The patient showed steadily improving mood over the course of her stay,  and no further hallucinations after her dose of Abilify was stabilized.  On the eighth hospital day, the patient indicated that she felt very  well, and felt confident enough to return to home and participation in  the intensive outpatient program.  She agreed to the following aftercare  plan.   AFTERCARE:  The patient was to follow-up in the IOP program beginning on  December 11.  She was also to continue with Hurley Cisco,  her  therapist.   DISCHARGE MEDICATIONS:  Abilify 20 mg q.h.s., and Seroquel 100 mg  q.h.s..  The patient was instructed to follow-up with her primary care  physician for medical conditions, including her polycystic ovarian  syndrome.   DISCHARGE DIAGNOSES:  AXIS I: Bipolar disorder NOS.  AXIS II: Deferred.  AXIS III: Polycystic ovarian syndrome.  AXIS IV: Stressors severe.  AXIS V: GAF on discharge 50.      Anselm Jungling, MD  Electronically Signed     SPB/MEDQ  D:  12/24/2006  T:  12/26/2006  Job:  [id number]

## 2010-05-23 NOTE — Op Note (Signed)
Natasha Lopez, BLUST               ACCOUNT NO.:  [id number]   MEDICAL RECORD NO.:  [id number]          PATIENT TYPE:  OIB   LOCATION:  6727                         FACILITY:  [facility]   PHYSICIAN:  Leonie Man, M.D.   DATE OF BIRTH:  01/27/1975   DATE OF PROCEDURE:  09/30/2005  DATE OF DISCHARGE:  10/01/2005                                 OPERATIVE REPORT   PREOPERATIVE DIAGNOSIS:  Chronic calculous cholecystitis.   POSTOPERATIVE DIAGNOSIS:  Chronic calculous cholecystitis.   PROCEDURE:  Laparoscopic cholecystectomy with interoperative cholangiogram.   SURGEON:  Leonie Man, M.D.   ASSISTANT:  Gita Kudo, M.D.   ANESTHESIA:  General.   SPECIMENS TO LAB:  Gallbladder and gallstones.   ESTIMATED BLOOD LOSS:  Minimal.   COMPLICATIONS DURING THE PROCEDURE:  None recognized.   DISPOSITION:  The patient to the PACU in excellent condition.   INDICATIONS:  The patient is a 35 year old morbidly obese female presenting  with epigastric and right upper quadrant pain radiating to the back  associated with nausea and vomiting. Cholelithiasis documented on  gallbladder ultrasonography.  Liver function studies were within normal  limits.  There was no hyperamylasemia or hyperlipasemia noted.  The patient  comes to the operating room after the risks and potential benefits of  surgery have been fully discussed.  All questions answered, consent  obtained.   DESCRIPTION OF PROCEDURE:  The patient was positioned supinely following the  induction of satisfactory general anesthesia. The abdomen is prepped and  draped to be included in a sterile operative field.  Open laparoscopy was  created at the umbilicus with insertion of a Hassan cannula and insufflation  of the peritoneal cavity to 14 mmHg pressure using carbon dioxide.  Visual  exploration of the abdomen was carried out.  The gallbladder was noted to be  chronically scarred.  The liver edges were somewhat rounded but  otherwise  smooth.  The liver surface was smooth.  The anterior gastric wall and  duodenum appeared to be normal.  None of the small or large intestine  visualized appeared to be abnormal.  The pelvic organs were not visualized.  The patient was turned into reversed Trendelenburg position and was tilted  somewhat to the left.  Epigastric and lateral trocars were placed under  direct vision.  The gallbladder was grasped and dissection carried down to  the region of hepatoduodenal ligament with isolation of the cystic artery  and cystic duct, the cystic duct being traced up to the gallbladder cystic  duct junction.  The cystic artery traced to the gallbladder wall. The cystic  duct was clipped proximally and then opened. The cystic duct cholangiogram  was carried out by passing a Cook catheter into the abdomen and intubating  the cystic duct through which 1/2 strength Hypaque was injected under  fluoroscopic control. The resulting cholangiogram showed free flow of  contrast into the duodenum with no filling defects and normal caliber  extrahepatic ducts.  Cholangiocatheter was removed and the cystic duct was  triply clipped and then transected. The cystic artery was dissected free and  doubly clipped  and transected.  The gallbladder was then dissected free from  the liver bed using electrocautery and maintaining hemostasis throughout the  entire course of dissection.  At the end of this dissection, the liver bed  was again checked for hemostasis.  Additional bleeding points were treated  with electrocautery.  The right upper quadrant was thoroughly irrigated with  multiple aliquots of normal saline.  The camera was then placed in the  epigastric port and the gallbladder was retrieved through the umbilical port  without difficulty.  Sponge, instrument and sharp counts were then fully  verified.  The trocars were removed under direct vision.  Pneumoperitoneum  was allowed to deflate and then  the wounds closed in the layers as follows.  The umbilical wound in two layers with 0 Vicryl and 4-0 Monocryl, epigastric  and flank wounds closed with 4-0 Monocryl.  All incisions were then  reinforced with Steri-Strips.  Sterile dressings were applied.  The  anesthetic was reversed and the patient removed from the operating room to  the recovery room in stable condition.  She tolerated the procedure well.      Leonie Man, M.D.  Electronically Signed     PB/MEDQ  D:  09/30/2005  T:  10/01/2005  Job:  [id number]

## 2010-09-25 LAB — BASIC METABOLIC PANEL
Calcium: 9.5
GFR calc non Af Amer: >60
Potassium: 3.5
Sodium: 138

## 2010-09-25 LAB — ETHANOL

## 2010-09-25 LAB — RAPID URINE DRUG SCREEN, HOSP PERFORMED
Cocaine: NOT DETECTED
Opiates: NOT DETECTED

## 2010-09-29 LAB — I-STAT 8, (EC8 V) (CONVERTED LAB)
BUN: 8
Bicarbonate: 27.5 — ABNORMAL HIGH
Chloride: 107
Glucose, Bld: 122 — ABNORMAL HIGH
HCT: 39
Hemoglobin: 13.3
Operator id: 294511
Sodium: 140

## 2010-09-29 LAB — DIFFERENTIAL
Basophils Absolute: 0
Eosinophils Relative: 2
Lymphocytes Relative: 25
Monocytes Absolute: 0.6
Monocytes Relative: 5
Neutro Abs: 9.2 — ABNORMAL HIGH

## 2010-09-29 LAB — CBC
HCT: 35.7 — ABNORMAL LOW
Hemoglobin: 12.3
MCHC: 34.5
RBC: 4.21
RDW: 15

## 2010-09-29 LAB — POCT CARDIAC MARKERS
Operator id: 294511
Troponin i, poc: <0.05

## 2010-09-29 LAB — POCT I-STAT CREATININE
Creatinine, Ser: 0.8
Operator id: 294511

## 2010-10-13 LAB — COMPREHENSIVE METABOLIC PANEL
AST: 16
Albumin: 3.1 — ABNORMAL LOW
BUN: 5 — ABNORMAL LOW
CO2: 30
Calcium: 8.9
Chloride: 106
Creatinine, Ser: 0.66
GFR calc Af Amer: >60
GFR calc non Af Amer: >60
Total Bilirubin: 0.5

## 2010-10-13 LAB — DRUGS OF ABUSE SCREEN W/O ALC, ROUTINE URINE
Amphetamine Screen, Ur: NEGATIVE
Barbiturate Quant, Ur: NEGATIVE
Cocaine Metabolites: NEGATIVE
Marijuana Metabolite: NEGATIVE
Methadone: NEGATIVE

## 2010-10-13 LAB — URINALYSIS, ROUTINE W REFLEX MICROSCOPIC
Bilirubin Urine: NEGATIVE
Leukocytes, UA: NEGATIVE
Nitrite: NEGATIVE
Specific Gravity, Urine: 1.019
Urobilinogen, UA: 0.2
pH: 6

## 2010-10-13 LAB — URINE MICROSCOPIC-ADD ON

## 2010-10-13 LAB — BENZODIAZEPINE, QUANTITATIVE, URINE
Nordiazepam GC/MS Conf: NEGATIVE
Oxazepam GC/MS Conf: NEGATIVE

## 2010-10-13 LAB — CBC
HCT: 39.3
MCHC: 33.6
MCV: 85.5
Platelets: 377
WBC: 10.2

## 2010-10-13 LAB — PREGNANCY, URINE

## 2010-11-07 ENCOUNTER — Inpatient Hospital Stay (INDEPENDENT_AMBULATORY_CARE_PROVIDER_SITE_OTHER)
Admission: RE | Admit: 2010-11-07 | Discharge: 2010-11-07 | Disposition: A | Discharge status: Home / Self Care | Payer: Medicare Other | Source: Ambulatory Visit | Attending: Family Medicine | Admitting: Family Medicine

## 2010-11-07 DIAGNOSIS — L0291 Cutaneous abscess, unspecified: Secondary | ICD-10-CM

## 2010-11-10 LAB — CULTURE, ROUTINE-ABSCESS

## 2011-09-14 ENCOUNTER — Telehealth: Payer: Self-pay | Admitting: Family Medicine

## 2011-09-14 NOTE — Telephone Encounter (Signed)
Pt called to Emergency Line with the complaint of abdominal pain that started last Friday, pain went completely away and returned today around 3-4:00pm. The pain is described as sharp localized "at the right side of stomach".  Pt has had nausea but not vomiting. She originally though to be from her menses she started last Thursday but the nature of pain has worried her.  She also comments an event of "passing out " on Friday morning after waking up with pain around 9:00 am,she states found herself shaking and sweaty and the next thing she noticed was waking up at 11:50 am. No hx of drug abuse or alcohol. No diarrheas, headache or chest pain. Advise pt to come to ED to get evaluated

## 2011-09-15 ENCOUNTER — Encounter (HOSPITAL_COMMUNITY): Payer: Self-pay | Admitting: *Deleted

## 2011-09-15 ENCOUNTER — Emergency Department (HOSPITAL_COMMUNITY): Payer: Medicare Other

## 2011-09-15 ENCOUNTER — Emergency Department (HOSPITAL_COMMUNITY)
Admission: EM | Admit: 2011-09-15 | Discharge: 2011-09-15 | Disposition: A | Discharge status: Home / Self Care | Payer: Medicare Other | Attending: Emergency Medicine | Admitting: Emergency Medicine

## 2011-09-15 DIAGNOSIS — I10 Essential (primary) hypertension: Secondary | ICD-10-CM | POA: Insufficient documentation

## 2011-09-15 DIAGNOSIS — E119 Type 2 diabetes mellitus without complications: Secondary | ICD-10-CM | POA: Insufficient documentation

## 2011-09-15 DIAGNOSIS — E282 Polycystic ovarian syndrome: Secondary | ICD-10-CM | POA: Insufficient documentation

## 2011-09-15 DIAGNOSIS — Z79899 Other long term (current) drug therapy: Secondary | ICD-10-CM | POA: Insufficient documentation

## 2011-09-15 DIAGNOSIS — N9489 Other specified conditions associated with female genital organs and menstrual cycle: Secondary | ICD-10-CM | POA: Insufficient documentation

## 2011-09-15 DIAGNOSIS — N949 Unspecified condition associated with female genital organs and menstrual cycle: Secondary | ICD-10-CM

## 2011-09-15 DIAGNOSIS — N390 Urinary tract infection, site not specified: Secondary | ICD-10-CM | POA: Insufficient documentation

## 2011-09-15 DIAGNOSIS — R109 Unspecified abdominal pain: Secondary | ICD-10-CM | POA: Insufficient documentation

## 2011-09-15 DIAGNOSIS — R000 Tachycardia, unspecified: Secondary | ICD-10-CM | POA: Insufficient documentation

## 2011-09-15 HISTORY — DX: Major depressive disorder, single episode, in full remission: F32.5

## 2011-09-15 HISTORY — DX: Essential (primary) hypertension: I10

## 2011-09-15 HISTORY — DX: Polycystic ovarian syndrome: E28.2

## 2011-09-15 LAB — COMPREHENSIVE METABOLIC PANEL
ALT: 16 U/L (ref 0–35)
AST: 18 U/L (ref 0–37)
Albumin: 3.9 g/dL (ref 3.5–5.2)
Calcium: 9.8 mg/dL (ref 8.4–10.5)
Creatinine, Ser: 0.93 mg/dL (ref 0.50–1.10)
GFR calc non Af Amer: 79 mL/min — ABNORMAL LOW (ref >90)
Sodium: 140 mEq/L (ref 135–145)
Total Protein: 8.2 g/dL (ref 6.0–8.3)

## 2011-09-15 LAB — URINALYSIS, ROUTINE W REFLEX MICROSCOPIC
Glucose, UA: NEGATIVE mg/dL
Specific Gravity, Urine: 1.015 (ref 1.005–1.030)
Urobilinogen, UA: 0.2 mg/dL (ref 0.0–1.0)
pH: 5.5 (ref 5.0–8.0)

## 2011-09-15 LAB — URINE MICROSCOPIC-ADD ON

## 2011-09-15 LAB — CBC WITH DIFFERENTIAL/PLATELET
Basophils Absolute: 0 10*3/uL (ref 0.0–0.1)
Basophils Relative: 0 % (ref 0–1)
Eosinophils Absolute: 0 10*3/uL (ref 0.0–0.7)
Eosinophils Relative: 0 % (ref 0–5)
HCT: 41.7 % (ref 36.0–46.0)
MCHC: 33.3 g/dL (ref 30.0–36.0)
MCV: 84.8 fL (ref 78.0–100.0)
Monocytes Absolute: 0.5 10*3/uL (ref 0.1–1.0)
Platelets: 459 10*3/uL — ABNORMAL HIGH (ref 150–400)
RDW: 13.9 % (ref 11.5–15.5)
WBC: 21.1 10*3/uL — ABNORMAL HIGH (ref 4.0–10.5)

## 2011-09-15 MED ORDER — IOHEXOL 300 MG/ML  SOLN
100.0000 mL | Freq: Once | INTRAMUSCULAR | Status: AC | PRN
  Administered 2011-09-15: 100 mL via INTRAVENOUS

## 2011-09-15 MED ORDER — MORPHINE SULFATE 4 MG/ML IJ SOLN
2.0000 mg | INTRAMUSCULAR | Status: DC | PRN
  Administered 2011-09-15: 2 mg via INTRAVENOUS
  Filled 2011-09-15: qty 1

## 2011-09-15 MED ORDER — PROMETHAZINE HCL 25 MG PO TABS: 25.0000 mg | ORAL_TABLET | Freq: Four times a day (QID) | ORAL | Status: DC | PRN

## 2011-09-15 MED ORDER — SODIUM CHLORIDE 0.9 % IV BOLUS (SEPSIS)
1000.0000 mL | Freq: Once | INTRAVENOUS | Status: AC
  Administered 2011-09-15: 1000 mL via INTRAVENOUS

## 2011-09-15 MED ORDER — DEXTROSE 5 % IV SOLN
1.0000 g | Freq: Once | INTRAVENOUS | Status: AC
  Administered 2011-09-15: 1 g via INTRAVENOUS
  Filled 2011-09-15: qty 10

## 2011-09-15 MED ORDER — NAPROXEN 500 MG PO TABS: 500.0000 mg | ORAL_TABLET | Freq: Two times a day (BID) | ORAL | Status: DC

## 2011-09-15 MED ORDER — ACETAMINOPHEN 325 MG PO TABS
650.0000 mg | ORAL_TABLET | Freq: Once | ORAL | Status: AC
  Administered 2011-09-15: 650 mg via ORAL
  Filled 2011-09-15: qty 2

## 2011-09-15 MED ORDER — CEPHALEXIN 500 MG PO CAPS: 500.0000 mg | ORAL_CAPSULE | Freq: Four times a day (QID) | ORAL | Status: AC

## 2011-09-15 MED ORDER — HYDROCODONE-ACETAMINOPHEN 5-500 MG PO TABS: 1.0000 | ORAL_TABLET | Freq: Four times a day (QID) | ORAL | Status: AC | PRN

## 2011-09-15 MED ORDER — IOHEXOL 300 MG/ML  SOLN: 20.0000 mL | INTRAMUSCULAR | Status: AC

## 2011-09-15 MED ORDER — ONDANSETRON HCL 4 MG/2ML IJ SOLN
4.0000 mg | Freq: Four times a day (QID) | INTRAMUSCULAR | Status: DC | PRN
  Administered 2011-09-15: 4 mg via INTRAVENOUS
  Filled 2011-09-15: qty 2

## 2011-09-15 NOTE — ED Provider Notes (Signed)
History     CSN: [id number]  Arrival date & time 09/15/11  0019   First MD Initiated Contact with Patient 09/15/11 0321      Chief Complaint  Patient presents with  . Abdominal Pain    (Consider location/radiation/quality/duration/timing/severity/associated sxs/prior treatment) HPI Comments: 36 year old female with a history of diabetes, polycystic ovarian syndrome and hypertension who is also had a cholecystectomy and a hernia repair in the past presents with a complaint of right lower cautery pain. She states this started on Friday, was present when she awakened and he came severe throughout the morning, she awoke from a nap around 11:00 in the morning on Friday and the pain was gone. During that time she was diaphoretic and nauseated. Saturday and Sunday were both pain-free days and on Monday she developed recurrent pain at 3:30 in the afternoon which has been persistent, gradually worsening and associated with nausea. The pain is not as intense as it was several days ago, it is located in the right lower quadrant and it is associated with some difficulty urinating several days ago but denies any dysuria fevers chills or back pain. She does have chronic diarrhea ever since her gallbladder was taken out. She does note that she started her menstrual cycle on Thursday and has had some cramping since that time but it is normal cramping for her.  Patient is a 36 y.o. female presenting with abdominal pain. The history is provided by the patient.  Abdominal Pain The primary symptoms of the illness include abdominal pain.    Past Medical History  Diagnosis Date  . Diabetes mellitus   . PCOS (polycystic ovarian syndrome)   . Hypertension   . 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 Cholecystectomy   . Hernia repair     No family history on file.  History  Substance Use Topics  . Smoking status: Former Games developer  . Smokeless  tobacco: Not on file  . Alcohol Use: Yes      occasional    OB History    Grav Para Term Preterm Abortions TAB SAB Ect Mult Living                  Review of Systems  Gastrointestinal: Positive for abdominal pain.  All other systems reviewed and are negative.    Allergies  Coconut oil  Home Medications   Current Outpatient Rx  Name Route Sig Dispense Refill  . CEPHALEXIN 500 MG PO CAPS Oral Take 1 capsule (500 mg total) by mouth 4 (four) times daily. 28 capsule 0  . HYDROCODONE-ACETAMINOPHEN 5-500 MG PO TABS Oral Take 1-2 tablets by mouth every 6 (six) hours as needed for pain. 15 tablet 0  . NAPROXEN 500 MG PO TABS Oral Take 1 tablet (500 mg total) by mouth 2 (two) times daily with a meal. 30 tablet 0  . PROMETHAZINE HCL 25 MG PO TABS Oral Take 1 tablet (25 mg total) by mouth every 6 (six) hours as needed for nausea. 12 tablet 0    BP 134/91  Pulse 98  Temp 99.7 F (37.6 C) (Oral)  Resp 20  Ht 5\' 5"  (1.651 m)  Wt 340 lb (154.223 kg)  BMI 56.58 kg/m2  SpO2 99%  LMP 09/10/2011  Physical Exam  Nursing note and vitals reviewed. Constitutional: She appears well-developed and well-nourished. No distress.  HENT:  Head: Normocephalic and atraumatic.  Mouth/Throat: Oropharynx is  clear and moist. No oropharyngeal exudate.  Eyes: Conjunctivae and EOM are normal. Pupils are equal, round, and reactive to light. Right eye exhibits no discharge. Left eye exhibits no discharge. No scleral icterus.  Neck: Normal range of motion. Neck supple. No JVD present. No thyromegaly present.  Cardiovascular: Regular rhythm, normal heart sounds and intact distal pulses.  Exam reveals no gallop and no friction rub.   No murmur heard.      Slight tachycardia to 105 on my exam, strong radial artery pulses bilaterally  Pulmonary/Chest: Effort normal and breath sounds normal. No respiratory distress. She has no wheezes. She has no rales.  Abdominal: Soft. Bowel sounds are normal. She exhibits no  distension and no mass. There is tenderness ( Focal right lower quadrant tenderness with mild guarding, positive Rovsing sign).  Musculoskeletal: Normal range of motion. She exhibits no edema and no tenderness.  Lymphadenopathy:    She has no cervical adenopathy.  Neurological: She is alert. Coordination normal.  Skin: Skin is warm and dry. No rash noted. No erythema.  Psychiatric: She has a normal mood and affect. Her behavior is normal.    ED Course  Procedures (including critical care time)  Labs Reviewed  CBC WITH DIFFERENTIAL - Abnormal; Notable for the following:    WBC 21.1 (*)     Platelets 459 (*)     Neutrophils Relative 88 (*)     Neutro Abs 18.5 (*)     Lymphocytes Relative 10 (*)     Monocytes Relative 2 (*)     All other components within normal limits  COMPREHENSIVE METABOLIC PANEL - Abnormal; Notable for the following:    Glucose, Bld 113 (*)     GFR calc non Af Amer 79 (*)     All other components within normal limits  URINALYSIS, ROUTINE W REFLEX MICROSCOPIC - Abnormal; Notable for the following:    Color, Urine AMBER (*)  BIOCHEMICALS MAY BE AFFECTED BY COLOR   APPearance CLOUDY (*)     Hgb urine dipstick LARGE (*)     Bilirubin Urine SMALL (*)     Protein, ur 30 (*)     Leukocytes, UA MODERATE (*)     All other components within normal limits  URINE MICROSCOPIC-ADD ON - Abnormal; Notable for the following:    Squamous Epithelial / LPF FEW (*)     Bacteria, UA MANY (*)     All other components within normal limits  URINE CULTURE   Ct Abdomen Pelvis W Contrast  09/15/2011  *RADIOLOGY REPORT*  Clinical Data: Right lower quadrant pain, nausea, diarrhea, fever.  CT ABDOMEN AND PELVIS WITH CONTRAST  Technique:  Multidetector CT imaging of the abdomen and pelvis was performed following the standard protocol during bolus administration of intravenous contrast.  Contrast: OMNIPAQUE IOHEXOL 300 MG/ML  SOLN  Comparison: None.  Findings: Mild dependent changes in  the lung bases.  Surgical absence of the gallbladder.  The liver, spleen, pancreas, kidneys, abdominal aorta, and retroperitoneal lymph nodes are unremarkable.  Fatty nodule in the left adrenal gland measuring 2 cm diameter consistent with adenoma.  The stomach, small bowel, and colon are not abnormally distended.  No free air or free fluid in the abdomen.  Pelvis:  Cystic structure in the left adnexa measuring 5.9 x 6.4 cm diameter suggesting an ovarian cyst.  This was suggested on previous pelvic ultrasound from 12/15/2008.  The lesion has enlarged since that time.  Follow-up ultrasound is suggested to exclude cystic  neoplasm.  Uterus and right ovary are not enlarged. The appendix is normal.  No diverticulitis.  No free or loculated pelvic fluid collections.  IMPRESSION: Enlarging cystic lesion in the left adnexa.  Follow-up ultrasound is recommended to exclude cystic neoplasm.  Appendix is normal.  No focal acute process demonstrated in the abdomen or pelvis.   Original Report Authenticated By: Marlon Pel, M.D.      1. Abdominal pain   2. Adnexal cyst   3. UTI (lower urinary tract infection)       MDM  The patient appears to be in mild discomfort, pain is currently 4/10 and she declines pain medications, when necessary medications ordered, CT scan ordered to rule out appendicitis. Her urinalysis reveals many bacteria and some white blood cells however her white blood cell count of 21,100 with a right lower quadrant pain raises concern for appendicitis and thus a CT scan has been ordered.  I have reevaluated the patient, her tachycardia has resolved she feels better after CT scans of the discussed with her, she is stable condition  Her urinary sample does appear consistent with a urinary tract infection, Rocephin given prior to discharge  Discharge Prescriptions include:  Keflex Naprosyn Hydrocodone Phenergan        Vida Roller, MD 09/15/11 [phone]

## 2011-09-15 NOTE — ED Notes (Signed)
Pt. Reports pain in right lower quadrant, tender to palpitation. Pt. States pain started on Friday 09/11/11, subsided and then began again 09/14/11. States "the pain was so strong I was sweating and had cold chills".

## 2011-09-15 NOTE — ED Notes (Addendum)
Pt began experiencing RLQ pain Fri.  She though it was r/t her menses b/c she has pcos and usually has painful periods.  Friday am she laid down and "passed out" several hours later.  Later on that day the pain diminished.  She c/o intermittent RLQ pain with diaphoresis.  Pt has diarrhea, but she always has diarrhea since her gall bladder was removed.  Pt still has her appendix.  Pt has had 1 ovarian cyst burst that ended up with her passing out.

## 2011-09-17 LAB — URINE CULTURE: Colony Count: >=100000

## 2011-09-18 NOTE — ED Notes (Signed)
+  Urine. Patient treated with Keflex. Sensitive to same. Per protocol MD. °

## 2011-10-22 ENCOUNTER — Telehealth: Payer: Self-pay | Admitting: *Deleted

## 2011-10-22 DIAGNOSIS — G4733 Obstructive sleep apnea (adult) (pediatric): Secondary | ICD-10-CM

## 2011-10-22 NOTE — Telephone Encounter (Signed)
Patient needs order new "band" for CPAP.  Band connects to nose piece and attaches around head.  Received CPAP from [facility].  Will route request to Dr. Tye Savoy and call back.  Gaylene Brooks, RN

## 2011-10-23 NOTE — Telephone Encounter (Signed)
Called [facility] and spoke to [place].  Patient has been "inactive" since 2010, so they will fax me a "certificate of medical necessity."  I will try my best to fill this out, but I may need patient to schedule appointment to see me if there are any questions on the form that I cannot answer on my own.  Thanks.

## 2011-10-23 NOTE — Telephone Encounter (Signed)
Returned call and informed that Dr. Tye Savoy will complete form.  Will call patient if office visit is needed based on form to be completed for [facility].  Gaylene Brooks, RN

## 2011-11-25 ENCOUNTER — Telehealth: Payer: Self-pay | Admitting: *Deleted

## 2011-11-25 NOTE — Telephone Encounter (Signed)
Left mess. For pt to schedule overdue A1C.

## 2011-12-16 NOTE — Telephone Encounter (Signed)
Patient's mother Legrand Como) stopped by office to check on status of CPAP headgear that was requested in October 2013.  Spoke with Mrs. Ward at [employer].  Since patient is inactive, will need for Dr. Tye Savoy to write order for CPAP supplies/headgear and fax with sleep study results to [phone].  Will route request to Dr. Tye Savoy.  Patient's mother has an appt here in office on Monday (12/21/11) and will check on status of CPAP supplies.   Gaylene Brooks, RN

## 2011-12-25 NOTE — Addendum Note (Signed)
Addended by: Tye Savoy, Emmanuela Ghazi on: 12/25/2011 12:55 AM   Modules accepted: Orders

## 2011-12-25 NOTE — Telephone Encounter (Addendum)
Hi Jeannette, I ordered a CPAP machine in [employer].  I still have not received sleep study results.  Can we request this from [facility] and then fax to [phone]?  Thanks!

## 2011-12-28 NOTE — Telephone Encounter (Signed)
CPAP order faxed to [facility]/[phone].  Per [facility]---last sleep study done March 2003 and they do not have copy available.  Records only available from 2008 to present.  De Nurse (Medical Records) will request copy of chart from storage.  Chart should be here by end of week and will fax copy of sleep study to [facility] at that time.  Gaylene Brooks, RN

## 2012-01-11 NOTE — Telephone Encounter (Signed)
Chart received in our office from storage facility.  Copy of sleep study from 2003 faxed to [facility] at [phone].  Copy of sleep study also placed in "to be scanned" box per Dr. Tye Savoy.  Gaylene Brooks, RN

## 2012-05-29 ENCOUNTER — Emergency Department (HOSPITAL_COMMUNITY)
Admission: EM | Admit: 2012-05-29 | Discharge: 2012-05-30 | Disposition: A | Discharge status: Home / Self Care | Payer: Medicare Other | Attending: Emergency Medicine | Admitting: Emergency Medicine

## 2012-05-29 ENCOUNTER — Encounter (HOSPITAL_COMMUNITY): Payer: Self-pay | Admitting: *Deleted

## 2012-05-29 DIAGNOSIS — R1031 Right lower quadrant pain: Secondary | ICD-10-CM | POA: Insufficient documentation

## 2012-05-29 DIAGNOSIS — Z87891 Personal history of nicotine dependence: Secondary | ICD-10-CM | POA: Insufficient documentation

## 2012-05-29 DIAGNOSIS — Z79899 Other long term (current) drug therapy: Secondary | ICD-10-CM | POA: Insufficient documentation

## 2012-05-29 DIAGNOSIS — E119 Type 2 diabetes mellitus without complications: Secondary | ICD-10-CM | POA: Insufficient documentation

## 2012-05-29 DIAGNOSIS — R110 Nausea: Secondary | ICD-10-CM | POA: Insufficient documentation

## 2012-05-29 DIAGNOSIS — Z3202 Encounter for pregnancy test, result negative: Secondary | ICD-10-CM | POA: Insufficient documentation

## 2012-05-29 DIAGNOSIS — Z8742 Personal history of other diseases of the female genital tract: Secondary | ICD-10-CM | POA: Insufficient documentation

## 2012-05-29 DIAGNOSIS — R109 Unspecified abdominal pain: Secondary | ICD-10-CM

## 2012-05-29 DIAGNOSIS — I10 Essential (primary) hypertension: Secondary | ICD-10-CM | POA: Insufficient documentation

## 2012-05-29 DIAGNOSIS — R51 Headache: Secondary | ICD-10-CM | POA: Insufficient documentation

## 2012-05-29 DIAGNOSIS — R509 Fever, unspecified: Secondary | ICD-10-CM | POA: Insufficient documentation

## 2012-05-29 DIAGNOSIS — IMO0002 Reserved for concepts with insufficient information to code with codable children: Secondary | ICD-10-CM | POA: Insufficient documentation

## 2012-05-29 LAB — COMPREHENSIVE METABOLIC PANEL
ALT: 16 U/L (ref 0–35)
AST: 14 U/L (ref 0–37)
CO2: 28 mEq/L (ref 19–32)
Calcium: 9.9 mg/dL (ref 8.4–10.5)
Potassium: 4 mEq/L (ref 3.5–5.1)
Sodium: 136 mEq/L (ref 135–145)
Total Protein: 8.1 g/dL (ref 6.0–8.3)

## 2012-05-29 LAB — URINALYSIS, ROUTINE W REFLEX MICROSCOPIC
Bilirubin Urine: NEGATIVE
Protein, ur: NEGATIVE mg/dL
Urobilinogen, UA: 0.2 mg/dL (ref 0.0–1.0)

## 2012-05-29 LAB — CBC WITH DIFFERENTIAL/PLATELET
Basophils Relative: 0 % (ref 0–1)
Eosinophils Absolute: 0 10*3/uL (ref 0.0–0.7)
Eosinophils Relative: 0 % (ref 0–5)
HCT: 40.4 % (ref 36.0–46.0)
Hemoglobin: 13.8 g/dL (ref 12.0–15.0)
MCH: 28.1 pg (ref 26.0–34.0)
MCHC: 34.2 g/dL (ref 30.0–36.0)
Monocytes Absolute: 0.5 10*3/uL (ref 0.1–1.0)
Monocytes Relative: 3 % (ref 3–12)

## 2012-05-29 LAB — URINE MICROSCOPIC-ADD ON

## 2012-05-29 MED ORDER — ACETAMINOPHEN 325 MG PO TABS
650.0000 mg | ORAL_TABLET | Freq: Once | ORAL | Status: AC
  Administered 2012-05-29: 650 mg via ORAL

## 2012-05-29 MED ORDER — SODIUM CHLORIDE 0.9 % IV SOLN: INTRAVENOUS | Status: DC

## 2012-05-29 MED ORDER — HYDROMORPHONE HCL PF 1 MG/ML IJ SOLN
1.0000 mg | Freq: Once | INTRAMUSCULAR | Status: AC
  Administered 2012-05-29: 1 mg via INTRAVENOUS
  Filled 2012-05-29: qty 1

## 2012-05-29 MED ORDER — ONDANSETRON 4 MG PO TBDP
8.0000 mg | ORAL_TABLET | Freq: Once | ORAL | Status: AC
  Administered 2012-05-29: 8 mg via ORAL

## 2012-05-29 MED ORDER — SODIUM CHLORIDE 0.9 % IV BOLUS (SEPSIS)
1000.0000 mL | Freq: Once | INTRAVENOUS | Status: AC
  Administered 2012-05-30: 1000 mL via INTRAVENOUS

## 2012-05-29 MED ORDER — SODIUM CHLORIDE 0.9 % IV BOLUS (SEPSIS)
2000.0000 mL | Freq: Once | INTRAVENOUS | Status: AC
  Administered 2012-05-29: 2000 mL via INTRAVENOUS

## 2012-05-29 MED ORDER — ONDANSETRON 4 MG PO TBDP
ORAL_TABLET | ORAL | Status: AC
  Administered 2012-05-29: 8 mg via ORAL
  Filled 2012-05-29: qty 2

## 2012-05-29 NOTE — ED Notes (Signed)
NURSE FIRST ROUNDS : NURSE EXPLAINED DELAY , WAIT TIME AND PROCESS . RESPIRATIONS UNLABORED , VITAL SIGNS RECHECKED , AMBULATORY , DENIES PAIN .

## 2012-05-29 NOTE — ED Provider Notes (Signed)
History     CSN: [id number]  Arrival date & time 05/29/12  4696   First MD Initiated Contact with Patient 05/29/12 2250      Chief Complaint  Patient presents with  . Abdominal Pain  . Fever  . Chills  . Headache    (Consider location/radiation/quality/duration/timing/severity/associated sxs/prior treatment) HPI 37 year old female has 2 days of constant right lower quadrant abdominal pain gradually worsening, yesterday she does have right lower quadrant abdominal pain without associated symptoms, today she has fever, chills, nausea without vomiting, she is no diarrhea, she does have some vaginal bleeding on her menses no vaginal discharge, she is no dysuria, she is no chest pain cough shortness of breath rash or confusion, she does have a gradual onset headache today was mild but became moderately severe and is now mild again, treatment yesterday consisted of several doses of ibuprofen after her abdominal pain started, she is no change in speech vision swallowing or understanding no focal or lateralizing weakness or numbness or incoordination, today in the emergency department consisted of antiemetic and her nausea is now resolved her headache is very mild she still is moderately severe right lower quadrant pain worse with palpation and position changes without radiation or colicky pain. Past Medical History  Diagnosis Date  . Diabetes mellitus   . PCOS (polycystic ovarian syndrome)   . Hypertension   . 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 Cholecystectomy    . Hernia repair      No family history on file.  History  Substance Use Topics  . Smoking status: Former Games developer  . Smokeless tobacco: Not on file  . Alcohol Use: Yes     Comment:  occasional    OB History   Grav Para Term Preterm Abortions TAB SAB Ect Mult Living                  Review of Systems 10 Systems reviewed and are negative for  acute change except as noted in the HPI. Allergies  Coconut oil  Home Medications   Current Outpatient Rx  Name  Route  Sig  Dispense  Refill  . ibuprofen (ADVIL,MOTRIN) 800 MG tablet   Oral   Take 800 mg by mouth every 8 (eight) hours as needed for pain.         Marland Kitchen omega-3 acid ethyl esters (LOVAZA) 1 G capsule   Oral   Take 1 g by mouth daily.         Marland Kitchen HYDROcodone-acetaminophen (NORCO/VICODIN) 5-325 MG per tablet   Oral   Take 2 tablets by mouth every 4 (four) hours as needed for pain.   10 tablet   0   . ondansetron (ZOFRAN) 4 MG tablet   Oral   Take 1 tablet (4 mg total) by mouth every 6 (six) hours.   12 tablet   0     BP 144/83  Pulse 86  Temp(Src) 99.4 F (37.4 C) (Oral)  Resp 18  SpO2 97%  LMP 05/24/2012  Physical Exam  Nursing note and vitals reviewed. Constitutional:  Awake, alert, nontoxic appearance.  HENT:  Head: Atraumatic.  Eyes: Right eye exhibits no discharge. Left eye exhibits no discharge.  Neck: Neck supple.  Cardiovascular: Regular rhythm.   No murmur heard. Tachycardic  Pulmonary/Chest: Effort normal and breath sounds normal. No respiratory distress. She has no wheezes. She has no rales. She exhibits  no tenderness.  Abdominal: Soft. Bowel sounds are normal. She exhibits no distension and no mass. There is tenderness. There is no rebound and no guarding.  Morbidly obese, mild to moderately tender right lower quadrant with the rest of the abdomen nontender without rebound  Genitourinary:  Chaperone present for bimanual examination patient has a small amount of red blood from her menses no discharge no pelvic or adnexal tenderness  Musculoskeletal: She exhibits no tenderness.  Baseline ROM, no obvious new focal weakness.  Neurological: She is alert.  Mental status and motor strength appears baseline for patient and situation.  Skin: No rash noted.  Psychiatric: She has a normal mood and affect.    ED Course  Procedures (including  critical care time) Patient / Family / Caregiver understand and agree with initial ED impression and plan with expectations set for ED visit. Labs Reviewed  COMPREHENSIVE METABOLIC PANEL - Abnormal; Notable for the following:    Glucose, Bld 160 (*)    GFR calc non Af Amer 85 (*)    All other components within normal limits  CBC WITH DIFFERENTIAL - Abnormal; Notable for the following:    WBC 19.5 (*)    Platelets 411 (*)    Neutrophils Relative % 86 (*)    Neutro Abs 16.9 (*)    Lymphocytes Relative 11 (*)    All other components within normal limits  URINALYSIS, ROUTINE W REFLEX MICROSCOPIC - Abnormal; Notable for the following:    APPearance CLOUDY (*)    Hgb urine dipstick LARGE (*)    Leukocytes, UA SMALL (*)    All other components within normal limits  URINE MICROSCOPIC-ADD ON - Abnormal; Notable for the following:    Squamous Epithelial / LPF FEW (*)    Bacteria, UA MANY (*)    All other components within normal limits  URINE CULTURE  CULTURE, BLOOD (ROUTINE X 2)  CULTURE, BLOOD (ROUTINE X 2)  LIPASE, BLOOD  POCT PREGNANCY, URINE  CG4 I-STAT (LACTIC ACID)   Ct Abdomen Pelvis W Contrast  05/30/2012   *RADIOLOGY REPORT*  Clinical Data: Right lower quadrant abdominal pain and tightness.  CT ABDOMEN AND PELVIS WITH CONTRAST  Technique:  Multidetector CT imaging of the abdomen and pelvis was performed following the standard protocol during bolus administration of intravenous contrast.  Contrast: OMNIPAQUE IOHEXOL 300 MG/ML  SOLN  Comparison: CT of the abdomen or pelvis performed 09/15/2011  Findings: The visualized lung bases are clear.  The liver and spleen are unremarkable in appearance.  The patient is status post cholecystectomy, with clips noted along the gallbladder fossa.  The pancreas and right adrenal gland are unremarkable.  The patient's left adrenal adenoma is difficult to fully characterize but may have increased in size, measuring perhaps 3.1 cm.  The kidneys are  unremarkable in appearance.  There is no evidence of hydronephrosis.  No renal or ureteral stones are seen.  No perinephric stranding is appreciated.  No free fluid is identified.  The small bowel is unremarkable in appearance.  The stomach is within normal limits.  No acute vascular abnormalities are seen.  There is mild prominence of periaortic nodes, of uncertain significance; this appears grossly stable from 2013.  The appendix is normal in caliber, without evidence for appendicitis.  The colon is unremarkable in appearance.  Contrast progresses to the level of the descending colon.  The bladder is mildly distended and grossly unremarkable in appearance.  The uterus is within normal limits.  Note is made  of a large 7.1 cm left adnexal cystic lesion; this has increased mildly in size from the prior CT.  This appears to have gradually increased in size from 2010, though as before, pelvic ultrasound is recommended for further evaluation.  An adjacent 3.9 cm cystic lesion is seen, also increased in size.  The ovaries are otherwise grossly symmetric.  No inguinal lymphadenopathy is seen.  No acute osseous abnormalities are identified.  IMPRESSION:  1.  Large 7.1 cm left adnexal cystic lesion has increased mildly in size from 2013.  Pelvic ultrasound is again recommended for further evaluation, on an elective non-emergent basis.  An adjacent 3.9 cm left adnexal cystic lesion has also increased in size. 2.  Left adrenal adenoma appears to have increased in size, though this is difficult to fully characterize.  It remains benign in appearance. 3.  Mild stable prominence of periaortic nodes, of uncertain significance; this appears unchanged from 2013. 4.  No additional abnormality seen within the abdomen or pelvis.   Original Report Authenticated By: Tonia Ghent, M.D.     1. Abdominal pain       MDM  Dispo pending; care endorsed to Dr. Dierdre Highman.        Hurman Horn, MD 05/30/12 [phone]

## 2012-05-29 NOTE — ED Notes (Addendum)
C/o recent toothache, now reports HA, abd pain, fever, chills. Onset yesterday. Started R mid abd, now periumbilical area. Last took ibuprofen yesterday (1600mg  split b/w 4 hrs). Thinks abd pain may have been from too much ibuprofen taken on empty stomach. LMP ended today. Denies rectal bleeding. Also nausea and dry heaves. Denies vomiting or diarrhea, "has been seen here for same sx before".

## 2012-05-30 ENCOUNTER — Emergency Department (HOSPITAL_COMMUNITY): Payer: Medicare Other

## 2012-05-30 LAB — URINE CULTURE: Culture: NO GROWTH

## 2012-05-30 MED ORDER — HYDROCODONE-ACETAMINOPHEN 5-325 MG PO TABS: 2.0000 | ORAL_TABLET | ORAL | Status: DC | PRN

## 2012-05-30 MED ORDER — IOHEXOL 300 MG/ML  SOLN
50.0000 mL | Freq: Once | INTRAMUSCULAR | Status: AC | PRN
  Administered 2012-05-30: 50 mL via ORAL

## 2012-05-30 MED ORDER — IOHEXOL 300 MG/ML  SOLN
100.0000 mL | Freq: Once | INTRAMUSCULAR | Status: AC | PRN
  Administered 2012-05-30: 100 mL via INTRAVENOUS

## 2012-05-30 MED ORDER — ONDANSETRON HCL 4 MG PO TABS: 4.0000 mg | ORAL_TABLET | Freq: Four times a day (QID) | ORAL | Status: DC

## 2012-05-30 NOTE — ED Notes (Signed)
Pt in route to CT scanner

## 2012-05-30 NOTE — ED Notes (Signed)
Pt has returned from CT.  

## 2012-06-05 LAB — CULTURE, BLOOD (ROUTINE X 2): Culture: NO GROWTH

## 2012-07-14 ENCOUNTER — Other Ambulatory Visit: Payer: Self-pay

## 2012-09-09 ENCOUNTER — Encounter: Payer: Self-pay | Admitting: Family Medicine

## 2012-09-09 ENCOUNTER — Other Ambulatory Visit: Payer: Self-pay | Admitting: Family Medicine

## 2012-09-09 DIAGNOSIS — G43909 Migraine, unspecified, not intractable, without status migrainosus: Secondary | ICD-10-CM | POA: Insufficient documentation

## 2012-09-09 DIAGNOSIS — D3502 Benign neoplasm of left adrenal gland: Secondary | ICD-10-CM

## 2012-09-09 DIAGNOSIS — N949 Unspecified condition associated with female genital organs and menstrual cycle: Secondary | ICD-10-CM | POA: Insufficient documentation

## 2012-09-09 DIAGNOSIS — R102 Pelvic and perineal pain: Secondary | ICD-10-CM

## 2012-09-09 HISTORY — DX: Migraine, unspecified, not intractable, without status migrainosus: G43.909

## 2012-09-19 ENCOUNTER — Ambulatory Visit (INDEPENDENT_AMBULATORY_CARE_PROVIDER_SITE_OTHER): Payer: Medicare Other | Admitting: Family Medicine

## 2012-09-19 ENCOUNTER — Encounter: Payer: Self-pay | Admitting: Family Medicine

## 2012-09-19 VITALS — BP 140/98 | HR 64 | Temp 98.1°F | Ht 65.0 in | Wt 346.0 lb

## 2012-09-19 DIAGNOSIS — L909 Atrophic disorder of skin, unspecified: Secondary | ICD-10-CM

## 2012-09-19 DIAGNOSIS — Z0000 Encounter for general adult medical examination without abnormal findings: Secondary | ICD-10-CM

## 2012-09-19 DIAGNOSIS — D3500 Benign neoplasm of unspecified adrenal gland: Secondary | ICD-10-CM

## 2012-09-19 DIAGNOSIS — D3502 Benign neoplasm of left adrenal gland: Secondary | ICD-10-CM

## 2012-09-19 DIAGNOSIS — E669 Obesity, unspecified: Secondary | ICD-10-CM

## 2012-09-19 DIAGNOSIS — E119 Type 2 diabetes mellitus without complications: Secondary | ICD-10-CM

## 2012-09-19 DIAGNOSIS — N949 Unspecified condition associated with female genital organs and menstrual cycle: Secondary | ICD-10-CM

## 2012-09-19 DIAGNOSIS — Z23 Encounter for immunization: Secondary | ICD-10-CM

## 2012-09-19 DIAGNOSIS — N9489 Other specified conditions associated with female genital organs and menstrual cycle: Secondary | ICD-10-CM

## 2012-09-19 DIAGNOSIS — L918 Other hypertrophic disorders of the skin: Secondary | ICD-10-CM

## 2012-09-19 DIAGNOSIS — I10 Essential (primary) hypertension: Secondary | ICD-10-CM

## 2012-09-19 DIAGNOSIS — F323 Major depressive disorder, single episode, severe with psychotic features: Secondary | ICD-10-CM

## 2012-09-19 DIAGNOSIS — G4733 Obstructive sleep apnea (adult) (pediatric): Secondary | ICD-10-CM

## 2012-09-19 DIAGNOSIS — E282 Polycystic ovarian syndrome: Secondary | ICD-10-CM

## 2012-09-19 LAB — LIPID PANEL
Cholesterol: 168 mg/dL (ref 0–200)
Total CHOL/HDL Ratio: 5.1 Ratio
Triglycerides: 179 mg/dL — ABNORMAL HIGH (ref <150)

## 2012-09-19 LAB — BASIC METABOLIC PANEL
BUN: 7 mg/dL (ref 6–23)
Calcium: 9.3 mg/dL (ref 8.4–10.5)
Glucose, Bld: 85 mg/dL (ref 70–99)
Sodium: 138 mEq/L (ref 135–145)

## 2012-09-19 LAB — POCT UA - MICROALBUMIN
Creatinine, POC: 200 mg/dL
Microalbumin Ur, POC: 80 mg/L

## 2012-09-19 LAB — POCT GLYCOSYLATED HEMOGLOBIN (HGB A1C): Hemoglobin A1C: 5.8

## 2012-09-19 MED ORDER — CHLORTHALIDONE 25 MG PO TABS: 12.5000 mg | ORAL_TABLET | Freq: Every day | ORAL | Status: DC

## 2012-09-19 NOTE — Patient Instructions (Addendum)
Take half a tablet of Chlorthalidone daily to help with your blood pressure.  Follow up with Dr Deirdre Priest in a month to see how well the medication is working on your blood pressure.   Contact your previous counselor to see if you can resume therapy with them.   If you need assistance locating a counselor in the community, you can contact Dr Pascal [name], Psy. D. To discuss possible counselors.   Take Tylenol 1000 mg three times a day for back pain.   Dr McDiarmid will call you if your tests are not good. Otherwise he will send you a letter.  If you sign up for MyChart online, you will be able to see your test results once Dr McDiarmid has reviewed them.  If you do not hear from [place] with in 2 weeks please call our office  Dr McDiarmid will set up CT and ultrasound to follow up the left ovarian cyst and adrenal adenoma.   Dr McDiarmid will look into possible therapies for Acanthosis Nigricans. We can work on some of your skin tags on next office visit with Dr McDiarmid

## 2012-09-20 ENCOUNTER — Other Ambulatory Visit: Payer: Self-pay | Admitting: *Deleted

## 2012-09-20 ENCOUNTER — Encounter: Payer: Self-pay | Admitting: Family Medicine

## 2012-09-20 ENCOUNTER — Other Ambulatory Visit: Payer: Self-pay | Admitting: Family Medicine

## 2012-09-20 DIAGNOSIS — N949 Unspecified condition associated with female genital organs and menstrual cycle: Secondary | ICD-10-CM

## 2012-09-20 DIAGNOSIS — D3502 Benign neoplasm of left adrenal gland: Secondary | ICD-10-CM

## 2012-09-20 DIAGNOSIS — E282 Polycystic ovarian syndrome: Secondary | ICD-10-CM

## 2012-09-20 DIAGNOSIS — Z0000 Encounter for general adult medical examination without abnormal findings: Secondary | ICD-10-CM | POA: Insufficient documentation

## 2012-09-20 DIAGNOSIS — L918 Other hypertrophic disorders of the skin: Secondary | ICD-10-CM | POA: Insufficient documentation

## 2012-09-20 NOTE — Assessment & Plan Note (Addendum)
PHQ-9 score 21.  Ongoing non-disturbing auditory and visual hallucinations. Natasha Lopez is reluctant to restart antidepressant and antipsychotics.  No thoughts of harming herself or others.  She feels she has had the best therapeutic response in past to counseling.  She wants to try to re-establish therapeutic relationship with her prior therapist now that she has insurance again. If she is unable to re-establish with this therapist, she may contact Dr Pascal [name] to discuss her options for therapists in the community.   I will be glad to assist with making referrals to therapists that patient designates. No plan to initiate psychotropic medications at this time as patient is reluctant to take them, her hallucinations are not troubling to her and she feels counseling has been as or more effective for her depression/anxiety symptoms as medications.

## 2012-09-20 NOTE — Assessment & Plan Note (Signed)
Will repeat CT to see if there has been progression in size of adenoma or development of concerning characteristics on CT. No clear evidence of inappropriate hormonal activity at this time.

## 2012-09-20 NOTE — Assessment & Plan Note (Signed)
Basic Metabolic Panel:    Component Value Date/Time   NA 138 09/19/2012 1624   K 4.2 09/19/2012 1624   CL 103 09/19/2012 1624   CO2 30 09/19/2012 1624   BUN 7 09/19/2012 1624   CREATININE 0.83 09/19/2012 1624   CREATININE 0.87 05/29/2012 2009   GLUCOSE 85 09/19/2012 1624   CALCIUM 9.3 09/19/2012 1624   No evidence of end organ damage. (+) microalbuminuria  Would like to start ACEI or ARB but given reproductive age on no contraception, hesitant to do so.  While thiazide may be mteabolically active, her DIABETES seems under control.  Will start Chlorthalidone 12.5 mg daily.  F/U with Dr Deirdre Priest in one month to assess response.

## 2012-09-20 NOTE — Assessment & Plan Note (Signed)
Discussed cryotherapy in stages on future office visits. Recommend that the tag on left eye lid be treated by ophthalmologist to lower risk of scar formation on this delicately functioning structure.

## 2012-09-20 NOTE — Progress Notes (Signed)
Subjective:    Patient ID: Natasha Lopez, female    DOB: 12-Aug-1975, 37 y.o.   MRN: [id number]  HPI Ms Natasha Lopez is re-establishing care at our practice.   Diagnosis  . Migraine, unspecified, without mention of intractable migraine without mention of status migrainosus - 3 to 4 times a month  . Adnexal cyst, left - Found on CT in May 2014. 7 cm left adnexal cystic lesion which had enlarged since 2013 CT.  Marland Kitchen Adenoma of left adrenal gland - 3.1 cm lesion found on CT in May 2014. Had enlarged from CT in 2013. No suspicious characteristics.   Marland Kitchen DIABETES MELLITUS, TYPE II, CONTROLLED - Diet controlled. - No checking at home. - Occasional chest pain and interscapular pain - Working on weight loss.  [place]  Eating more food from garden and less fast foods.   Marland Kitchen SLEEP APNEA, OBSTRUCTIVE - Has CPAP maching and mask that she wears every night.  (+) refreshing sleep.   . ESSENTIAL HYPERTENSION, BENIGN - Not on any antihypertensive medications -   . POLYCYSTIC OVARY - Previously did not have menstrual periods before, but for last two years the menstrual periods have been regular.  -   . OBESITY, NOS - Working on weight loss with diet and exercise (beginners Pilates)   . DEPRESSION, MAJOR, WITH PSYCHOTIC BEHAVIOR - Not on antidepressants nor antipsychotic medications - Natasha Lopez does notice mood swings the week before her periods.  - Ended a positive therapeutic relationship with a counselor when she lost her insurance.  She currently has insurance (Disability - Medicare) and would like to re-establish counseling relationship, ideally with her previous counselor if possible.  - Natasha Lopez is bothered a little by not having someone to turn to when she has a problem.  - Natasha Lopez has ongoing auditory hallucinations that like a crowd of people talking in a low background noise.  The experience is not disturbing to her and she has learned to ignore it most of the  time.    - She has intermittent non-threatening/frightening visual hallucinations, e.g., a [place] bear in a tree.  - Her hallucinations/delusions tend to become disturbing only in times of stress.  For example, she once called the police on herself because she believed she had killed two people.  - Natasha Lopez has been attending [employer] with intention of majoring in Chemistry.  She is currently not enrolled in classes for this semester as she "takes a break to work on herself".  She plans on returning to classes in the Spring.  - Natasha Lopez only major stressors currently are the care of her two daughters.  - PHQ-9 today: (+) ahedonia and depressed mood for more than half the days with total score of 21 points.   She is bothered by her health and weight and finances  a lot.   Natasha Lopez perceives her symptoms make it very difficult to fulfill her roles and deal with others.  - Panic Attacks (-) in last 4 weeks, thought she reports history of Panic Attacks about twice a month.     Skin problems - Skin tags around neck, left eyelid and left axilla. - Acanthosis Nigricans  Back pain, chronic      Review of Systems  Constitutional: Positive for fever and chills. Negative for activity change and unexpected weight change.  HENT: Negative for mouth sores.   Eyes: Negative for visual disturbance.  Respiratory: Negative for cough and shortness of  breath.   Cardiovascular: Positive for chest pain. Negative for palpitations and leg swelling.  Gastrointestinal: Negative for nausea, vomiting, constipation and anal bleeding.  Endocrine: Negative for polydipsia and polyuria.  Genitourinary: Negative for dysuria, urgency, vaginal bleeding, vaginal discharge and menstrual problem.  Musculoskeletal: Positive for back pain.  Skin:       Skin tags aound neck and left eyelid  Allergic/Immunologic: Negative for immunocompromised state.  Neurological: Positive for headaches. Negative for dizziness and syncope.   Psychiatric/Behavioral: Positive for hallucinations and dysphoric mood. Negative for suicidal ideas, behavioral problems, confusion, self-injury, decreased concentration and agitation. The patient is nervous/anxious.    Review of Systems  Constitutional: Positive for fever and chills. Negative for activity change and unexpected weight change.  HENT: Negative for mouth sores.   Eyes: Negative for visual disturbance.  Respiratory: Negative for cough and shortness of breath.   Cardiovascular: Positive for chest pain. Negative for palpitations and leg swelling.  Gastrointestinal: Negative for nausea, vomiting, constipation and anal bleeding.  Genitourinary: Negative for dysuria, urgency, vaginal bleeding, vaginal discharge and menstrual problem.  Musculoskeletal: Positive for back pain.  Skin:       Skin tags aound neck and left eyelid  Neurological: Positive for headaches. Negative for dizziness and syncope.  Endo/Heme/Allergies: Negative for polydipsia.  Psychiatric/Behavioral: Positive for hallucinations and dysphoric mood. Negative for suicidal ideas, behavioral problems, confusion, self-injury, decreased concentration and agitation. The patient is nervous/anxious.         Objective:   Physical Exam  Constitutional: She is oriented to person, place, and time.  Morbid obesity with truncal distribution  HENT:  Head: Normocephalic.  Right Ear: External ear normal.  Left Ear: External ear normal.  Eyes: Pupils are equal, round, and reactive to light. Right eye exhibits no discharge. No scleral icterus.  Neck: Normal range of motion. Neck supple. No thyromegaly present.  Cardiovascular: Normal rate, regular rhythm and normal heart sounds.   Pulmonary/Chest: Effort normal and breath sounds normal.  Abdominal: Soft. Bowel sounds are normal.  Lymphadenopathy:    She has no cervical adenopathy.  Neurological: She is alert and oriented to person, place, and time.  Skin:  Skin tags  neck Velvety skin neck folds Skin tag left upper eyelid  Psychiatric: She has a normal mood and affect. Her behavior is normal. Judgment and thought content normal.          Assessment & Plan:

## 2012-09-20 NOTE — Assessment & Plan Note (Signed)
Patient working on diet and exercise.  Pt feels she is being successful.  Encourgaement given

## 2012-09-20 NOTE — Assessment & Plan Note (Signed)
Needs pap in coming office visits at some point  Sent fo diabetic retinopathy screening test.

## 2012-09-20 NOTE — Assessment & Plan Note (Signed)
Will arrange TVUS/pelvic ultrasound to follow up this large cystic lesion to decide if it has resolved or has characteristics requiring GYN consultation.  Pt with known PCOS.

## 2012-09-20 NOTE — Assessment & Plan Note (Signed)
Lab Results  Component Value Date   HGBA1C 5.8 09/19/2012  Adequate glycemic control with diet and exercise.  No new end-organ damage.  Continue diet and exercise.

## 2012-09-20 NOTE — Assessment & Plan Note (Signed)
Wearing CPAP at night. (+) refresshing sleep. Concern that maching is very old.  Recently purchased new mask.

## 2012-09-20 NOTE — Assessment & Plan Note (Signed)
Menses are currently regular. Pt with DMT2 and evidence of hyperadrogenemia (hirsuitism) Not currently on metformin of hormonal therapy.

## 2012-09-22 ENCOUNTER — Ambulatory Visit (HOSPITAL_COMMUNITY)
Admission: RE | Admit: 2012-09-22 | Discharge: 2012-09-22 | Disposition: A | Discharge status: Home / Self Care | Payer: Medicare Other | Source: Ambulatory Visit | Attending: Family Medicine | Admitting: Family Medicine

## 2012-09-22 DIAGNOSIS — N838 Other noninflammatory disorders of ovary, fallopian tube and broad ligament: Secondary | ICD-10-CM | POA: Insufficient documentation

## 2012-09-22 DIAGNOSIS — N9489 Other specified conditions associated with female genital organs and menstrual cycle: Secondary | ICD-10-CM | POA: Insufficient documentation

## 2012-09-22 DIAGNOSIS — E282 Polycystic ovarian syndrome: Secondary | ICD-10-CM

## 2012-09-22 DIAGNOSIS — N949 Unspecified condition associated with female genital organs and menstrual cycle: Secondary | ICD-10-CM

## 2012-09-22 DIAGNOSIS — E119 Type 2 diabetes mellitus without complications: Secondary | ICD-10-CM | POA: Insufficient documentation

## 2012-09-22 DIAGNOSIS — I10 Essential (primary) hypertension: Secondary | ICD-10-CM | POA: Insufficient documentation

## 2012-09-22 DIAGNOSIS — D251 Intramural leiomyoma of uterus: Secondary | ICD-10-CM | POA: Insufficient documentation

## 2012-09-24 ENCOUNTER — Telehealth: Payer: Self-pay | Admitting: Family Medicine

## 2012-09-24 DIAGNOSIS — N949 Unspecified condition associated with female genital organs and menstrual cycle: Secondary | ICD-10-CM

## 2012-09-24 DIAGNOSIS — E282 Polycystic ovarian syndrome: Secondary | ICD-10-CM

## 2012-09-24 NOTE — Telephone Encounter (Signed)
I was unable to reach Ms Sharelle Burditt via her home or mobile phone.   I left message with her mother, Legrand Como, that I would set up a GYN consultation referral for evaluation of the patient's persistent ovarian cyst.    Will make referral to Gynecologic Oncology service at [facility] at [facility].   If they are unable to accept the referral, will make referral to  [facility]) for evaluation

## 2012-10-06 ENCOUNTER — Ambulatory Visit: Payer: Medicare Other | Admitting: Gynecologic Oncology

## 2012-10-11 ENCOUNTER — Ambulatory Visit: Payer: Medicare Other | Admitting: Lab

## 2012-10-11 ENCOUNTER — Ambulatory Visit: Payer: Medicare Other | Attending: Gynecologic Oncology | Admitting: Gynecologic Oncology

## 2012-10-11 ENCOUNTER — Encounter: Payer: Self-pay | Admitting: Gynecologic Oncology

## 2012-10-11 VITALS — BP 162/104 | HR 95 | Temp 98.5°F | Resp 20 | Ht 65.51 in | Wt 336.0 lb

## 2012-10-11 DIAGNOSIS — N949 Unspecified condition associated with female genital organs and menstrual cycle: Secondary | ICD-10-CM

## 2012-10-11 DIAGNOSIS — I10 Essential (primary) hypertension: Secondary | ICD-10-CM | POA: Insufficient documentation

## 2012-10-11 DIAGNOSIS — D3500 Benign neoplasm of unspecified adrenal gland: Secondary | ICD-10-CM | POA: Insufficient documentation

## 2012-10-11 DIAGNOSIS — N926 Irregular menstruation, unspecified: Secondary | ICD-10-CM | POA: Insufficient documentation

## 2012-10-11 DIAGNOSIS — R1031 Right lower quadrant pain: Secondary | ICD-10-CM | POA: Insufficient documentation

## 2012-10-11 DIAGNOSIS — E119 Type 2 diabetes mellitus without complications: Secondary | ICD-10-CM | POA: Insufficient documentation

## 2012-10-11 DIAGNOSIS — R112 Nausea with vomiting, unspecified: Secondary | ICD-10-CM | POA: Insufficient documentation

## 2012-10-11 DIAGNOSIS — L680 Hirsutism: Secondary | ICD-10-CM | POA: Insufficient documentation

## 2012-10-11 DIAGNOSIS — N9489 Other specified conditions associated with female genital organs and menstrual cycle: Secondary | ICD-10-CM | POA: Insufficient documentation

## 2012-10-11 DIAGNOSIS — D251 Intramural leiomyoma of uterus: Secondary | ICD-10-CM | POA: Insufficient documentation

## 2012-10-11 DIAGNOSIS — E282 Polycystic ovarian syndrome: Secondary | ICD-10-CM | POA: Insufficient documentation

## 2012-10-11 NOTE — Patient Instructions (Addendum)
Please follow-up with Dr. Cherly Hensen for removal of the ovarian cyst.  Ovarian Cyst The ovaries are small organs that are on each side of the uterus. The ovaries are the organs that produce the female hormones, estrogen and progesterone. An ovarian cyst is a sac filled with fluid that can vary in its size. It is normal for a small cyst to form in women who are in the childbearing age and who have menstrual periods. This type of cyst is called a follicle cyst that becomes an ovulation cyst (corpus luteum cyst) after it produces the women's egg. It later goes away on its own if the woman does not become pregnant. There are other kinds of ovarian cysts that may cause problems and may need to be treated. The most serious problem is a cyst with cancer. It should be noted that menopausal women who have an ovarian cyst are at a higher risk of it being a cancer cyst. They should be evaluated very quickly, thoroughly and followed closely. This is especially true in menopausal women because of the high rate of ovarian cancer in women in menopause. CAUSES AND TYPES OF OVARIAN CYSTS:  FUNCTIONAL CYST: The follicle/corpus luteum cyst is a functional cyst that occurs every month during ovulation with the menstrual cycle. They go away with the next menstrual cycle if the woman does not get pregnant. Usually, there are no symptoms with a functional cyst.  ENDOMETRIOMA CYST: This cyst develops from the lining of the uterus tissue. This cyst gets in or on the ovary. It grows every month from the bleeding during the menstrual period. It is also called a "chocolate cyst" because it becomes filled with blood that turns brown. This cyst can cause pain in the lower abdomen during intercourse and with your menstrual period.  CYSTADENOMA CYST: This cyst develops from the cells on the outside of the ovary. They usually are not cancerous. They can get very big and cause lower abdomen pain and pain with intercourse. This type of cyst  can twist on itself, cut off its blood supply and cause severe pain. It also can easily rupture and cause a lot of pain.  DERMOID CYST: This type of cyst is sometimes found in both ovaries. They are found to have different kinds of body tissue in the cyst. The tissue includes skin, teeth, hair, and/or cartilage. They usually do not have symptoms unless they get very big. Dermoid cysts are rarely cancerous.  POLYCYSTIC OVARY: This is a rare condition with hormone problems that produces many small cysts on both ovaries. The cysts are follicle-like cysts that never produce an egg and become a corpus luteum. It can cause an increase in body weight, infertility, acne, increase in body and facial hair and lack of menstrual periods or rare menstrual periods. Many women with this problem develop type 2 diabetes. The exact cause of this problem is unknown. A polycystic ovary is rarely cancerous.  THECA LUTEIN CYST: Occurs when too much hormone (human chorionic gonadotropin) is produced and over-stimulates the ovaries to produce an egg. They are frequently seen when doctors stimulate the ovaries for invitro-fertilization (test tube babies).  LUTEOMA CYST: This cyst is seen during pregnancy. Rarely it can cause an obstruction to the birth canal during labor and delivery. They usually go away after delivery. SYMPTOMS   Pelvic pain or pressure.  Pain during sexual intercourse.  Increasing girth (swelling) of the abdomen.  Abnormal menstrual periods.  Increasing pain with menstrual periods.  You stop having  menstrual periods and you are not pregnant. DIAGNOSIS  The diagnosis can be made during:  Routine or annual pelvic examination (common).  Ultrasound.  X-ray of the pelvis.  CT Scan.  MRI.  Blood tests. TREATMENT   Treatment may only be to follow the cyst monthly for 2 to 3 months with your caregiver. Many go away on their own, especially functional cysts.  May be aspirated (drained) with  a long needle with ultrasound, or by laparoscopy (inserting a tube into the pelvis through a small incision).  The whole cyst can be removed by laparoscopy.  Sometimes the cyst may need to be removed through an incision in the lower abdomen.  Hormone treatment is sometimes used to help dissolve certain cysts.  Birth control pills are sometimes used to help dissolve certain cysts. HOME CARE INSTRUCTIONS  Follow your caregiver's advice regarding:  Medicine.  Follow up visits to evaluate and treat the cyst.  You may need to come back or make an appointment with another caregiver, to find the exact cause of your cyst, if your caregiver is not a gynecologist.  Get your yearly and recommended pelvic examinations and Pap tests.  Let your caregiver know if you have had an ovarian cyst in the past. SEEK MEDICAL CARE IF:   Your periods are late, irregular, they stop, or are painful.  Your stomach (abdomen) or pelvic pain does not go away.  Your stomach becomes larger or swollen.  You have pressure on your bladder or trouble emptying your bladder completely.  You have painful sexual intercourse.  You have feelings of fullness, pressure, or discomfort in your stomach.  You lose weight for no apparent reason.  You feel generally ill.  You become constipated.  You lose your appetite.  You develop acne.  You have an increase in body and facial hair.  You are gaining weight, without changing your exercise and eating habits.  You think you are pregnant. SEEK IMMEDIATE MEDICAL CARE IF:   You have increasing abdominal pain.  You feel sick to your stomach (nausea) and/or vomit.  You develop a fever that comes on suddenly.  You develop abdominal pain during a bowel movement.  Your menstrual periods become heavier than usual. Document Released: 12/22/2004 Document Revised: 03/16/2011 Document Reviewed: 10/25/2008 [facility] Patient Information 2014 [place], [place].
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
## 2012-10-14 ENCOUNTER — Telehealth: Payer: Self-pay | Admitting: Gynecologic Oncology

## 2012-10-14 NOTE — Telephone Encounter (Signed)
Informed of CA 125 results.  No concerns voiced.

## 2012-10-26 ENCOUNTER — Ambulatory Visit (INDEPENDENT_AMBULATORY_CARE_PROVIDER_SITE_OTHER): Payer: Medicare Other | Admitting: Family Medicine

## 2012-10-26 ENCOUNTER — Encounter: Payer: Self-pay | Admitting: Family Medicine

## 2012-10-26 VITALS — BP 158/110 | HR 102 | Temp 98.6°F | Ht 65.5 in | Wt 337.0 lb

## 2012-10-26 DIAGNOSIS — I10 Essential (primary) hypertension: Secondary | ICD-10-CM

## 2012-10-26 DIAGNOSIS — N949 Unspecified condition associated with female genital organs and menstrual cycle: Secondary | ICD-10-CM

## 2012-10-26 DIAGNOSIS — N9489 Other specified conditions associated with female genital organs and menstrual cycle: Secondary | ICD-10-CM

## 2012-10-26 DIAGNOSIS — D3502 Benign neoplasm of left adrenal gland: Secondary | ICD-10-CM

## 2012-10-26 DIAGNOSIS — D3500 Benign neoplasm of unspecified adrenal gland: Secondary | ICD-10-CM

## 2012-10-26 NOTE — Assessment & Plan Note (Addendum)
Poorly controlled although headaches are better.  Just took hygroton before visit today.  Will increase diuretic and follow closely.  Her blood pressure in past seems to have been erratic.  She can not remember what medications she took in the past.  Is using CPAP regularly but forgot to put on last PM.

## 2012-10-26 NOTE — Patient Instructions (Addendum)
Good to see you today!  Thanks for coming in.  Take a whole blood pressure medication tablet every day  Come back in 2 weeks for a blood test and a blood pressure check  We will schedule a MRI for the adrenal mass  I will call you after your blood test and blood pressure check

## 2012-10-26 NOTE — Progress Notes (Signed)
  Subjective:    Patient ID: Natasha Lopez, female    DOB: 08/10/1975, 37 y.o.   MRN: [id number]  HPI  HYPERTENSION Disease Monitoring Home BP Monitoring Not checking Chest pain- no    Dyspnea- no Medications Compliance-  Daily 1/2 diuretic - took < 30 min before todays visit Lightheadedness-  no  Edema- no Her headaches are much improved since starting medication  ROS - See HPI  PMH Lab Review   Potassium  Date Value Range Status  09/19/2012 4.2  3.5 - 5.3 mEq/L Final     Sodium  Date Value Range Status  09/19/2012 138  135 - 145 mEq/L Final     Creat  Date Value Range Status  09/19/2012 0.83  0.50 - 1.10 mg/dL Final     Creatinine, Ser  Date Value Range Status  05/29/2012 0.87  0.50 - 1.10 mg/dL Final       Adrenal Adenoma No symptoms of abdomen pain or weight changes.  [place] reading suggest MRI   Review of Symptoms - see HPI  PMH - Smoking status noted.       Review of Systems     Objective:   Physical Exam Alert no acute distress Heart - Regular rate and rhythm.  No murmurs, gallops or rubs.    Lungs:  Normal respiratory effort, chest expands symmetrically. Lungs are clear to auscultation, no crackles or wheezes. Extremities:  No cyanosis, edema, or deformity noted with good range of motion of all major joints.          Assessment & Plan:

## 2012-10-31 NOTE — Addendum Note (Signed)
Addended by: Jone Baseman D on: 10/31/2012 02:58 PM   Modules accepted: Orders

## 2012-11-02 ENCOUNTER — Telehealth: Payer: Self-pay | Admitting: *Deleted

## 2012-11-02 NOTE — Telephone Encounter (Signed)
Natasha Lopez informed and agreeable.  Appt made and lab aware. Fleeger, Maryjo Rochester

## 2012-11-02 NOTE — Telephone Encounter (Signed)
Tasha from Radiology needs to speak with someone regarding labs for MRI tomorrow. Will forward to team.

## 2012-11-02 NOTE — Telephone Encounter (Signed)
Spoke with Harlin Heys and pt needs to get her creatinine drawn for MRI.  Future order in there and will call pt in morning to come in and have it done before 5pm MRI. Jazmin Hartsell,CMA

## 2012-11-03 ENCOUNTER — Other Ambulatory Visit (HOSPITAL_COMMUNITY): Payer: Self-pay | Admitting: Diagnostic Radiology

## 2012-11-03 ENCOUNTER — Other Ambulatory Visit: Payer: Medicare Other

## 2012-11-03 ENCOUNTER — Other Ambulatory Visit: Payer: Self-pay | Admitting: Family Medicine

## 2012-11-03 ENCOUNTER — Ambulatory Visit (HOSPITAL_COMMUNITY)
Admission: RE | Admit: 2012-11-03 | Discharge: 2012-11-03 | Disposition: A | Discharge status: Home / Self Care | Payer: Medicare Other | Source: Ambulatory Visit | Attending: Diagnostic Radiology | Admitting: Diagnostic Radiology

## 2012-11-03 ENCOUNTER — Ambulatory Visit (HOSPITAL_COMMUNITY)
Admission: RE | Admit: 2012-11-03 | Discharge: 2012-11-03 | Disposition: A | Discharge status: Home / Self Care | Payer: Medicare Other | Source: Ambulatory Visit | Attending: Family Medicine | Admitting: Family Medicine

## 2012-11-03 DIAGNOSIS — D3502 Benign neoplasm of left adrenal gland: Secondary | ICD-10-CM

## 2012-11-03 DIAGNOSIS — I10 Essential (primary) hypertension: Secondary | ICD-10-CM

## 2012-11-03 DIAGNOSIS — T1591XA Foreign body on external eye, part unspecified, right eye, initial encounter: Secondary | ICD-10-CM

## 2012-11-03 DIAGNOSIS — Z1389 Encounter for screening for other disorder: Secondary | ICD-10-CM | POA: Insufficient documentation

## 2012-11-03 DIAGNOSIS — Z77018 Contact with and (suspected) exposure to other hazardous metals: Secondary | ICD-10-CM | POA: Insufficient documentation

## 2012-11-03 DIAGNOSIS — N949 Unspecified condition associated with female genital organs and menstrual cycle: Secondary | ICD-10-CM

## 2012-11-03 LAB — BASIC METABOLIC PANEL
BUN: 14 mg/dL (ref 6–23)
CO2: 31 mEq/L (ref 19–32)
Glucose, Bld: 128 mg/dL — ABNORMAL HIGH (ref 70–99)
Potassium: 4 mEq/L (ref 3.5–5.3)
Sodium: 141 mEq/L (ref 135–145)

## 2012-11-03 NOTE — Progress Notes (Signed)
STAT BMP DONE TODAY Ginette Bradway 

## 2012-11-08 ENCOUNTER — Telehealth: Payer: Self-pay | Admitting: Family Medicine

## 2012-11-08 LAB — HEMOGLOBIN A1C: Hgb A1c MFr Bld: 6.2 % — AB (ref 4.0–6.0)

## 2012-11-08 NOTE — Assessment & Plan Note (Signed)
Pelvic US recommended MRI with contract to follow up of adnexal mass.  Will order

## 2012-11-08 NOTE — Telephone Encounter (Signed)
When talking with Natasha Lopez about her daughter I asked about the MRI for her pelvic mass.  She related she could not get it because could not fit in the machine?  I have not received any communication from MRI about this. Will await information from Radiology Patient informed to discuss with Dr McDiarmid when she sees him next month

## 2012-11-09 ENCOUNTER — Other Ambulatory Visit: Payer: Medicare Other

## 2012-11-10 NOTE — Telephone Encounter (Signed)
See below  Can her Pelvic MRI with contrast be scheduled at the open [facility]?   Or does she just need to go there since the order is still open? Can you tell her the address Thanks LC

## 2012-11-11 NOTE — Telephone Encounter (Signed)
appt made and location changed thru insurance.  Pt informed. Natasha Lopez, Natasha Lopez

## 2012-11-23 ENCOUNTER — Ambulatory Visit
Admission: RE | Admit: 2012-11-23 | Discharge: 2012-11-23 | Disposition: A | Discharge status: Home / Self Care | Payer: Medicare Other | Source: Ambulatory Visit | Attending: Family Medicine | Admitting: Family Medicine

## 2012-11-23 ENCOUNTER — Other Ambulatory Visit: Payer: Self-pay | Admitting: Family Medicine

## 2012-11-23 ENCOUNTER — Ambulatory Visit: Admission: RE | Admit: 2012-11-23 | Payer: Medicare Other | Source: Ambulatory Visit

## 2012-11-23 DIAGNOSIS — N949 Unspecified condition associated with female genital organs and menstrual cycle: Secondary | ICD-10-CM

## 2012-11-23 MED ORDER — GADOBENATE DIMEGLUMINE 529 MG/ML IV SOLN
20.0000 mL | Freq: Once | INTRAVENOUS | Status: AC | PRN
  Administered 2012-11-23: 20 mL via INTRAVENOUS

## 2012-11-24 NOTE — Addendum Note (Signed)
Addended by: Oneal Grout on: 11/24/2012 11:02 AM   Modules accepted: Orders

## 2012-11-28 ENCOUNTER — Encounter: Payer: Self-pay | Admitting: Family Medicine

## 2012-11-28 ENCOUNTER — Telehealth: Payer: Self-pay | Admitting: Family Medicine

## 2012-11-28 NOTE — Telephone Encounter (Signed)
Left voicemail about reassuring test and that I would send a letter

## 2012-12-09 ENCOUNTER — Encounter: Payer: Self-pay | Admitting: Family Medicine

## 2012-12-22 ENCOUNTER — Other Ambulatory Visit: Payer: Self-pay | Admitting: Family Medicine

## 2012-12-22 MED ORDER — CHLORTHALIDONE 25 MG PO TABS: 25.0000 mg | ORAL_TABLET | Freq: Every day | ORAL | Status: DC

## 2013-06-06 ENCOUNTER — Telehealth: Payer: Self-pay | Admitting: Family Medicine

## 2013-06-28 NOTE — Telephone Encounter (Signed)
Called to setup appointment for AWV. Setup 60 min. Appointment when Pt calls back.

## 2013-07-13 ENCOUNTER — Other Ambulatory Visit: Payer: Self-pay | Admitting: Family Medicine

## 2013-10-20 ENCOUNTER — Other Ambulatory Visit: Payer: Self-pay

## 2014-05-29 IMAGING — US US PELVIS COMPLETE
1 series · 13 of 25 positions shown · non-contrast
Comparison: None.

CLINICAL DATA: Follow up adnexal cyst



[Series 1: us pelvis complete · 13 of 51 slices shown]
[im 1/51]
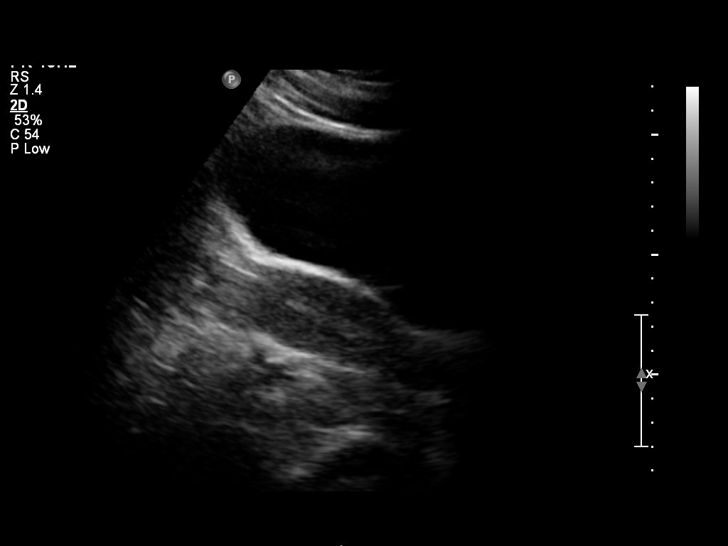
[im 5/51]
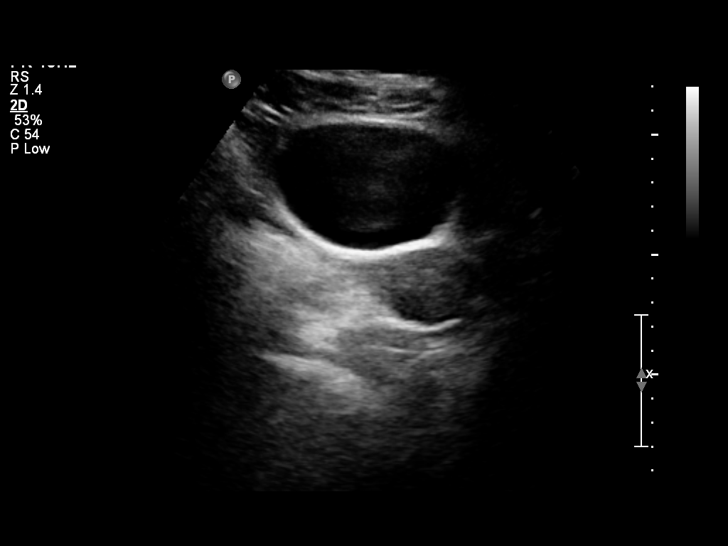
[im 9/51]
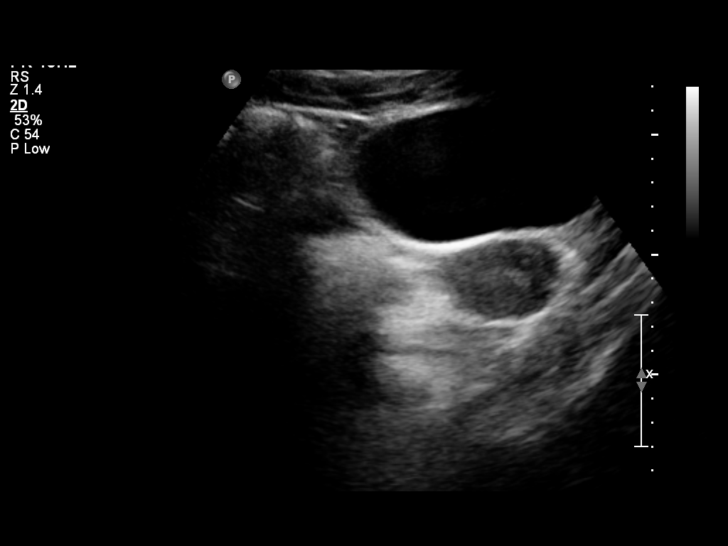
[im 13/51]
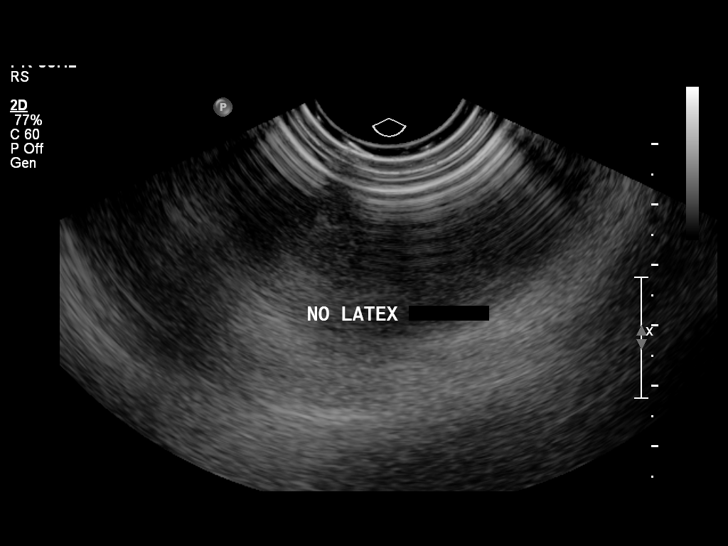
[im 17/51]
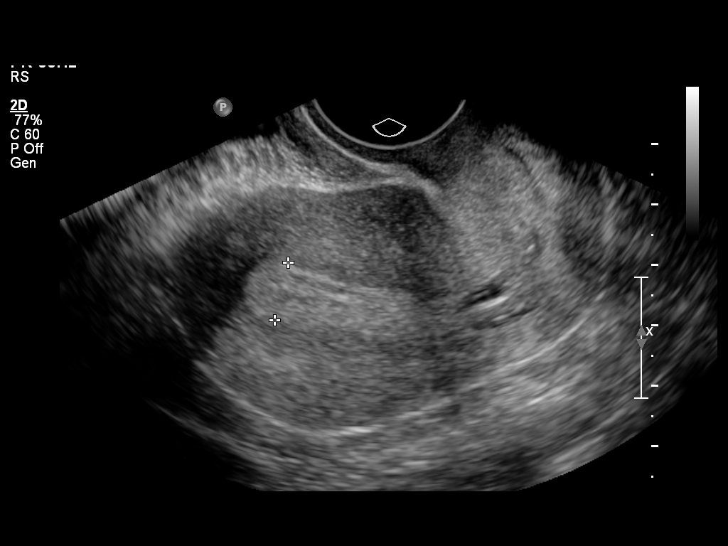
[im 21/51]
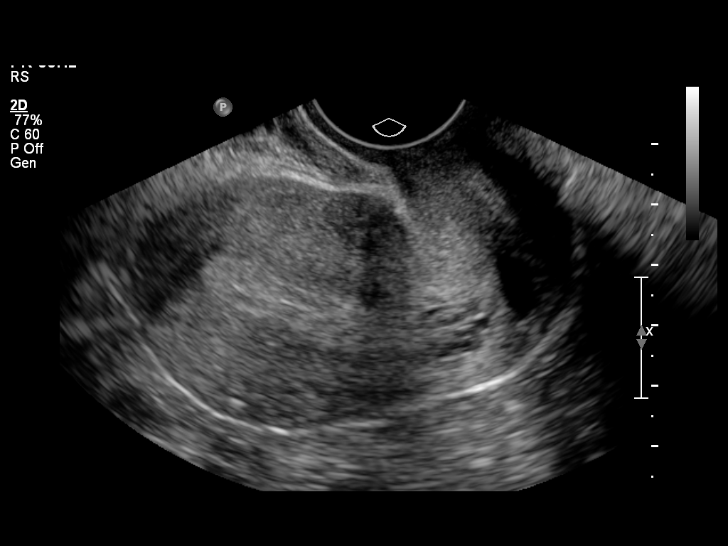
[im 26/51]
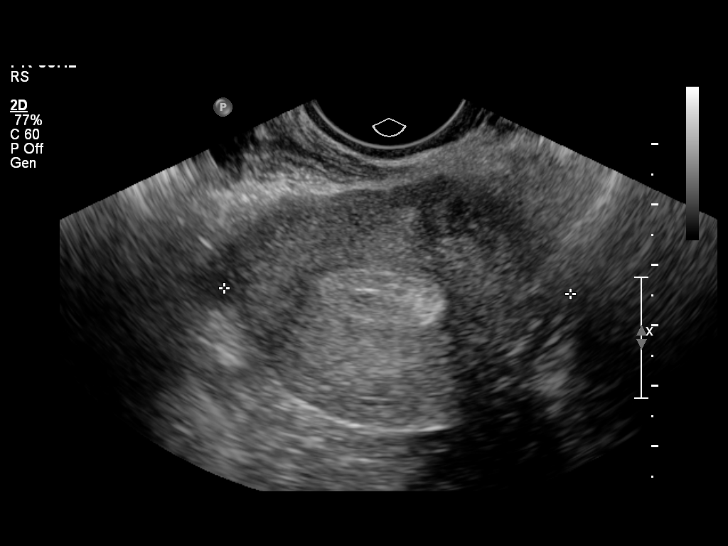
[im 30/51]
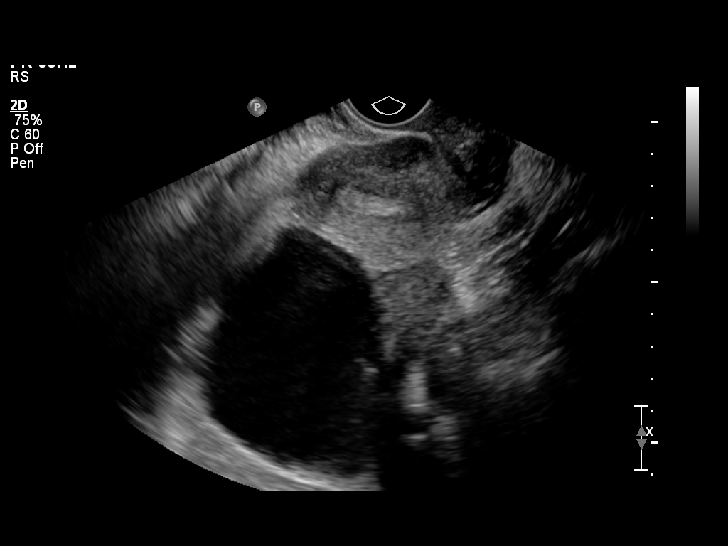
[im 34/51]
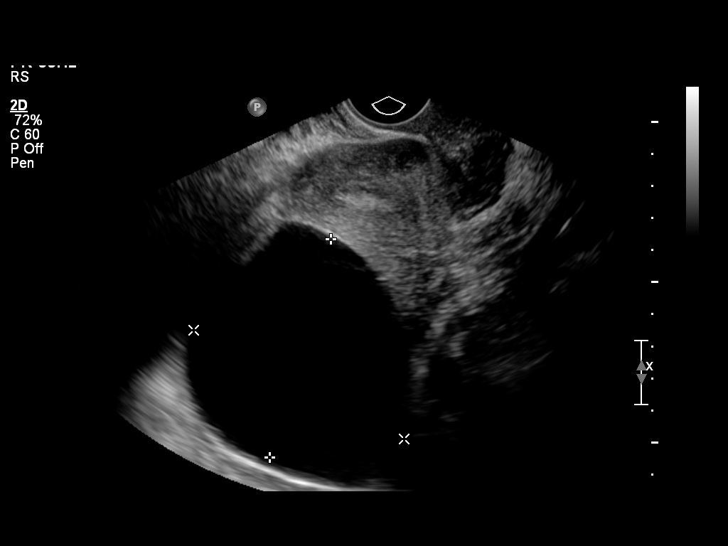
[im 38/51]
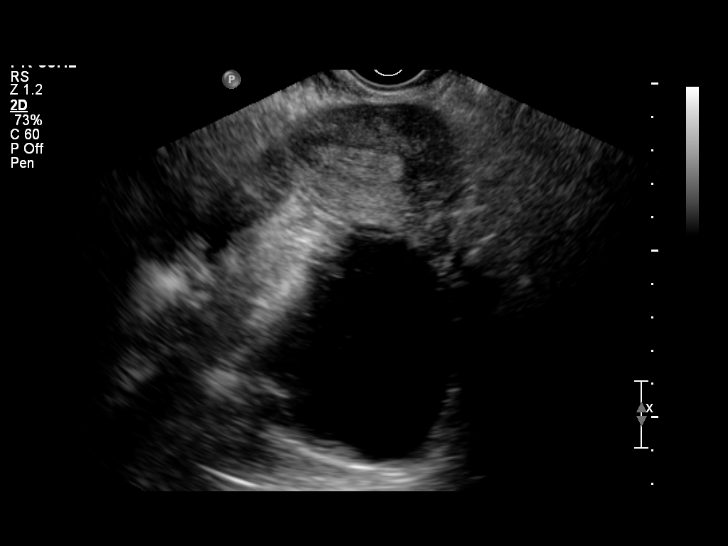
[im 42/51]
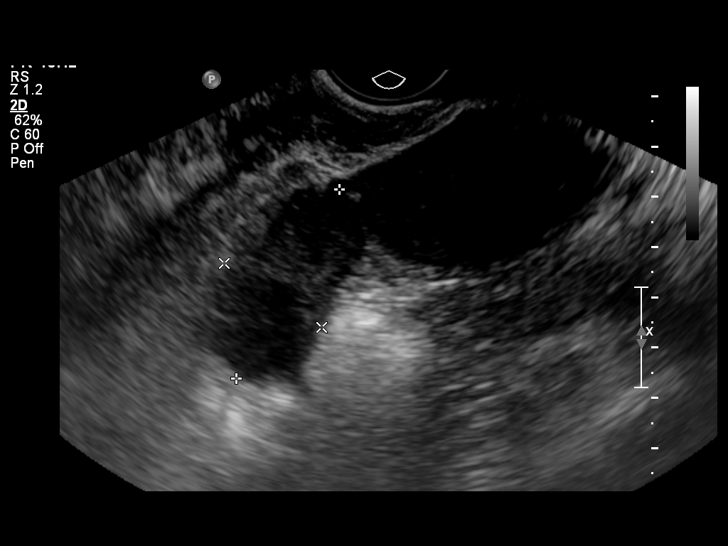
[im 46/51]
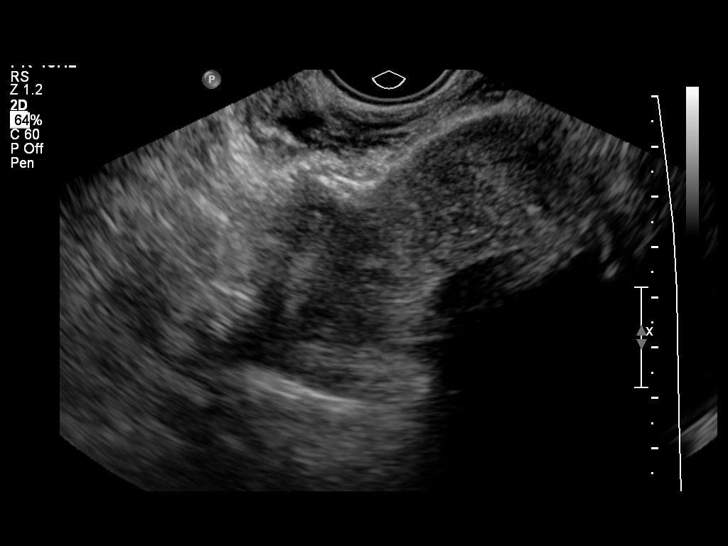
[im 51/51]
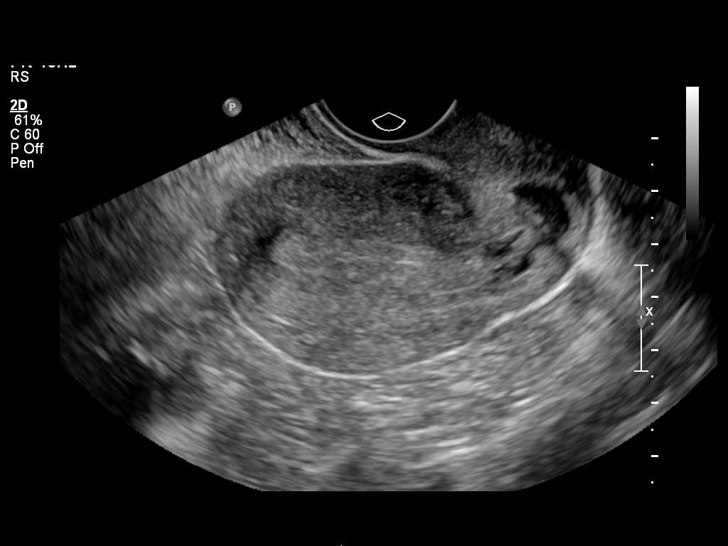

[13 of 25 positions shown; findings below may reference images not displayed]

FINDINGS: Uterus:  Measures 7.4 x 4.3 x 5.7 cm.  There is an intramural
fibroid within the left side of uterus measuring 1.7 x 1.2 x
cm.  Endometrium: Normal in thickness and appearance.  Measures 10
mm.

Right ovary: Appears normal.  Measures 4.3 x 2.30 x 2.8 cm.

Left ovary: The left ovary is enlarged measuring 7.9 x 7.9 x
cm.  Anechoic cyst within the left adnexa/ovary measures1.8 x 7.4 x
6.1 cm. Previously this measured 7.1 x 6.7 x 7.7 cm.  There is
increased through transmission.  No septations or nodularity
associated with this cyst.

Other Findings:  No free fluid
IMPRESSION: 1. Persistent, 7 cm cyst within the left adnexa compared with
05/30/2012.  Further evaluation is recommended.  Either a contrast-
enhanced pelvic MRI or surgical evaluation would be advised.
Recommendations follow the Society of Radiologists in Ultrasound
3121 Consensus Conference Statement (Zeny Bohannon et al.  Management of
Asymptomatic Ovarian and Other Adnexal Cysts Imaged at US:  Society
of Radiologists in Ultrasound Consensus Conference Statement 3121.
Radiology [DATE]):  [id number].).

## 2014-07-02 ENCOUNTER — Other Ambulatory Visit: Payer: Self-pay

## 2018-12-26 ENCOUNTER — Other Ambulatory Visit: Payer: Self-pay

## 2018-12-26 ENCOUNTER — Ambulatory Visit: Payer: HRSA Program | Attending: Internal Medicine

## 2018-12-26 DIAGNOSIS — Z20828 Contact with and (suspected) exposure to other viral communicable diseases: Secondary | ICD-10-CM | POA: Diagnosis not present

## 2018-12-26 DIAGNOSIS — Z20822 Contact with and (suspected) exposure to covid-19: Secondary | ICD-10-CM

## 2018-12-27 LAB — NOVEL CORONAVIRUS, NAA: SARS-CoV-2, NAA: NOT DETECTED

## 2019-01-10 ENCOUNTER — Other Ambulatory Visit: Payer: Self-pay

## 2019-01-10 ENCOUNTER — Encounter (HOSPITAL_COMMUNITY): Payer: Self-pay

## 2019-01-10 ENCOUNTER — Emergency Department (HOSPITAL_COMMUNITY): Payer: Medicare Other

## 2019-01-10 ENCOUNTER — Emergency Department (HOSPITAL_COMMUNITY)
Admission: EM | Admit: 2019-01-10 | Discharge: 2019-01-10 | Disposition: A | Discharge status: Home / Self Care | Payer: Medicare Other | Attending: Emergency Medicine | Admitting: Emergency Medicine

## 2019-01-10 DIAGNOSIS — R0600 Dyspnea, unspecified: Secondary | ICD-10-CM

## 2019-01-10 DIAGNOSIS — Z87891 Personal history of nicotine dependence: Secondary | ICD-10-CM | POA: Insufficient documentation

## 2019-01-10 DIAGNOSIS — Z79899 Other long term (current) drug therapy: Secondary | ICD-10-CM | POA: Insufficient documentation

## 2019-01-10 DIAGNOSIS — R0609 Other forms of dyspnea: Secondary | ICD-10-CM | POA: Insufficient documentation

## 2019-01-10 DIAGNOSIS — M109 Gout, unspecified: Secondary | ICD-10-CM | POA: Diagnosis not present

## 2019-01-10 DIAGNOSIS — I10 Essential (primary) hypertension: Secondary | ICD-10-CM | POA: Diagnosis not present

## 2019-01-10 DIAGNOSIS — K649 Unspecified hemorrhoids: Secondary | ICD-10-CM

## 2019-01-10 DIAGNOSIS — E119 Type 2 diabetes mellitus without complications: Secondary | ICD-10-CM | POA: Insufficient documentation

## 2019-01-10 DIAGNOSIS — K644 Residual hemorrhoidal skin tags: Secondary | ICD-10-CM | POA: Diagnosis not present

## 2019-01-10 LAB — BASIC METABOLIC PANEL
Anion gap: 13 (ref 5–15)
BUN: 13 mg/dL (ref 6–20)
CO2: 23 mmol/L (ref 22–32)
Calcium: 9.1 mg/dL (ref 8.9–10.3)
Chloride: 101 mmol/L (ref 98–111)
Creatinine, Ser: 0.93 mg/dL (ref 0.44–1.00)
GFR calc Af Amer: >60 mL/min (ref >60)
GFR calc non Af Amer: >60 mL/min (ref >60)
Glucose, Bld: 161 mg/dL — ABNORMAL HIGH (ref 70–99)
Potassium: 4.3 mmol/L (ref 3.5–5.1)
Sodium: 137 mmol/L (ref 135–145)

## 2019-01-10 LAB — D-DIMER, QUANTITATIVE: D-Dimer, Quant: 0.41 ug/mL-FEU (ref 0.00–0.50)

## 2019-01-10 LAB — POC OCCULT BLOOD, ED: Fecal Occult Bld: NEGATIVE

## 2019-01-10 LAB — TROPONIN I (HIGH SENSITIVITY): Troponin I (High Sensitivity): 3 ng/L (ref <18)

## 2019-01-10 LAB — CBC
HCT: 40.9 % (ref 36.0–46.0)
Hemoglobin: 13.1 g/dL (ref 12.0–15.0)
MCH: 28.7 pg (ref 26.0–34.0)
MCHC: 32 g/dL (ref 30.0–36.0)
MCV: 89.5 fL (ref 80.0–100.0)
Platelets: 398 10*3/uL (ref 150–400)
RBC: 4.57 MIL/uL (ref 3.87–5.11)
RDW: 13.1 % (ref 11.5–15.5)
WBC: 14.7 10*3/uL — ABNORMAL HIGH (ref 4.0–10.5)
nRBC: 0 % (ref 0.0–0.2)

## 2019-01-10 MED ORDER — INDOMETHACIN 25 MG PO CAPS: 25.0000 mg | ORAL_CAPSULE | Freq: Three times a day (TID) | ORAL | 0 refills | Status: AC | PRN

## 2019-01-10 MED ORDER — HYDROCORTISONE ACETATE 25 MG RE SUPP: 25.0000 mg | Freq: Two times a day (BID) | RECTAL | 0 refills | Status: DC

## 2019-01-10 NOTE — ED Triage Notes (Signed)
Patient complains of bilateral foot and ankle pain that she relates to her gout pain, not currently taking meds for same. Also complains of noting blood in stool. Patient alert and oriented. Also complains of pain with inspiration. Speaking complete sentences

## 2019-01-10 NOTE — ED Provider Notes (Addendum)
[facility] EMERGENCY DEPARTMENT Provider Note   CSN: [id number] Arrival date & time: 01/10/19  [id number]     History No chief complaint on file.   [name] is a 44 y.o. female.  Patient is a 44 y/o obese female with PMH of diabetes, PCOS presenting to the ER with multiple complaints. Patient reports her biggest concern is her gout flare up in her bilateral feet. Reports hx of gout for several years with flare ups once per month. Reports swelling and pain to her feel which is her normal area of gout but that the pain seemed to spread into her shins and was worse than before. Took colchicine last night and currently now feeling improved. No injury or trauma, swelling, fever or skin changes. Second, patient reports BRBPR since today x2. Reports history of hemorrhoids. Initially had no rectal pain but "after sitting in the ER al day" now has rectal pain similar to past episodes of hemorrhoids. Denies belly pain, n/v. Third, patient reports SOB and chest pain over the last 2 weeks. Reports significant SOB with walking short distances, even to the bathroom which is in her bedroom at home. Reports feeling pressure in the center of her chest "like a hiccup" which occasionally happens with walking but also happens after laying in bed for some time. No exacerbation or relief of chest pain. Had normal stress test a few years ago. Denies chest pain or SOB while here in the ER. Not on hormonal birth control. No increased leg swelling. No Covid sx or exposure.         Past Medical History:  Diagnosis Date  . Diabetes mellitus   . Hypertension   . 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 Migraine, unspecified, without mention of intractable migraine without mention of status migrainosus 09/09/2012  . PCOS (polycystic ovarian syndrome)     Patient Active Problem List   Diagnosis Date Noted  . Cutaneous skin tags 09/20/2012  . Healthcare maintenance  09/20/2012  . Migraine, unspecified, without mention of intractable migraine without mention of status migrainosus 09/09/2012  . Adnexal cyst, left 09/09/2012  . Adenoma of left adrenal gland 09/09/2012  . DIABETES MELLITUS, TYPE II, CONTROLLED 01/16/2009  . SLEEP APNEA, OBSTRUCTIVE 03/31/2007  . ESSENTIAL HYPERTENSION, BENIGN 03/31/2007  . POLYCYSTIC OVARY 03/04/2006  . OBESITY, NOS 03/04/2006  . DEPRESSION, MAJOR, WITH PSYCHOTIC BEHAVIOR 03/04/2006  . CARPAL TUNNEL SYNDROME 03/04/2006  . HIRSUTISM 03/04/2006    Past Surgical History:  Procedure Laterality Date  . CHOLECYSTECTOMY  2007  . HERNIA REPAIR  1980     OB History   No obstetric history on file.     Family History  Problem Relation Age of Onset  . Alcohol abuse Father   . Depression Father   . Drug abuse Father   . Asthma Mother   . Depression Mother   . Hypertension Mother   . Depression Brother     Social History   Tobacco Use  . Smoking status: Former Smoker    Quit date: 08/09/2008    Years since quitting: 10.4  Substance Use Topics  . Alcohol use: Yes    Comment:  occasional, ~ once a year.   . Drug use: No    Home Medications Prior to Admission medications   Medication Sig Start Date End Date Taking? Authorizing Provider  acetaminophen (TYLENOL) 500 MG tablet Take 2 tablets (1,000 mg total) by mouth 3 (three) times daily.  09/20/12   McDiarmid, Blane Ohara, MD  chlorthalidone (HYGROTON) 25 MG tablet Take 1 tablet (25 mg total) by mouth daily. 12/22/12   McDiarmid, Blane Ohara, MD  hydrocortisone (ANUSOL-HC) 25 MG suppository Place 1 suppository (25 mg total) rectally 2 (two) times daily. 01/10/19   Alveria Apley, PA-C  indomethacin (INDOCIN) 25 MG capsule Take 1 capsule (25 mg total) by mouth 3 (three) times daily as needed for up to 4 days. 01/10/19 01/14/19  Madilyn Hook A, PA-C  omega-3 acid ethyl esters (LOVAZA) 1 G capsule Take 1 g by mouth daily.    [provider]    Allergies    Coconut  oil  Review of Systems   Review of Systems  Constitutional: Negative for appetite change, chills, diaphoresis, fatigue and fever.  HENT: Negative for congestion and sore throat.   Respiratory: Positive for shortness of breath. Negative for cough, chest tightness and wheezing.   Cardiovascular: Positive for chest pain and palpitations. Negative for leg swelling.  Gastrointestinal: Positive for blood in stool. Negative for abdominal distention, abdominal pain, anal bleeding, constipation, diarrhea, nausea, rectal pain and vomiting.  Genitourinary: Negative for dysuria and hematuria.  Musculoskeletal: Positive for arthralgias and gait problem. Negative for back pain, myalgias and neck stiffness.  Skin: Negative for rash.  Neurological: Negative for dizziness, light-headedness and headaches.    Physical Exam Updated Vital Signs BP (!) 117/48   Pulse 86   Temp 98.6 F (37 C) (Oral)   Resp (!) 26   Ht 5' 5.5" (1.664 m)   Wt (!) 154.2 kg   SpO2 97%   BMI 55.72 kg/m   Physical Exam Vitals and nursing note reviewed. Exam conducted with a chaperone present.  Constitutional:      General: She is not in acute distress.    Appearance: Normal appearance. She is obese. She is not ill-appearing, toxic-appearing or diaphoretic.  HENT:     Head: Normocephalic.     Mouth/Throat:     Mouth: Mucous membranes are moist.  Eyes:     Conjunctiva/sclera: Conjunctivae normal.     Pupils: Pupils are equal, round, and reactive to light.  Cardiovascular:     Rate and Rhythm: Normal rate and regular rhythm.  Pulmonary:     Effort: Pulmonary effort is normal.  Abdominal:     Tenderness: There is no abdominal tenderness. There is no guarding.  Genitourinary:    Comments: + tender external hemorrhoid. Does not appear thrombosed. Musculoskeletal:        General: No swelling. Normal range of motion.     Right foot: Normal range of motion and normal capillary refill. No swelling, deformity, bunion,  laceration, tenderness, bony tenderness or crepitus. Normal pulse.     Left foot: Normal range of motion and normal capillary refill. Swelling (mild over dosrum of foot) and tenderness present. No deformity, bunion, laceration, bony tenderness or crepitus. Normal pulse.  Skin:    General: Skin is dry.     Capillary Refill: Capillary refill takes less than 2 seconds.     Findings: No bruising, erythema or rash.  Neurological:     General: No focal deficit present.     Mental Status: She is alert.  Psychiatric:        Mood and Affect: Mood normal.     ED Results / Procedures / Treatments   Labs (all labs ordered are listed, but only abnormal results are displayed) Labs Reviewed  CBC - Abnormal; Notable for the following components:  Result Value   WBC 14.7 (*)    All other components within normal limits  BASIC METABOLIC PANEL - Abnormal; Notable for the following components:   Glucose, Bld 161 (*)    All other components within normal limits  D-DIMER, QUANTITATIVE (NOT AT [facility])  POC OCCULT BLOOD, ED  TROPONIN I (HIGH SENSITIVITY)  TROPONIN I (HIGH SENSITIVITY)    EKG None  Radiology DG Chest 2 View  Result Date: 01/10/2019 CLINICAL DATA:  Dyspnea on exertion EXAM: CHEST - 2 VIEW COMPARISON:  03/27/2007 FINDINGS: Cardiac shadows within normal limits. The lungs are well aerated bilaterally. No focal infiltrate or sizable effusion is seen. No acute bony abnormality is noted. IMPRESSION: No active cardiopulmonary disease. Electronically Signed   By: Inez Catalina M.D.   On: 01/10/2019 22:36    Procedures Procedures (including critical care time)  Medications Ordered in ED Medications - No data to display  ED Course  I have reviewed the triage vital signs and the nursing notes.  Pertinent labs & imaging results that were available during my care of the patient were reviewed by me and considered in my medical decision making (see chart for details).  Clinical Course as  of Jan 10 2256  Tue Jan 10, 2019  2138 Patient here for multiple complaints. I think that her foot pain is her usual gout pain and is now improved after colchicine. Likely, she would need to be on daily therapy with allopurinol given she has flare ups once a month and reports making dietary changes without improvement. Her hemoglobin is stable despite reports of BRBPR x2 today. Will perform rectal exam and hemocult. I will workup patient for cardio/pulmonary causes of her chest pain and SOB which is my biggest concern today from an ER standpoint given her symptoms and risk factors. Overall appears well with normal vitals at this time.    [KM]  [id number] Patients labs and workup reassuring including negative trop and ddimer. Hemoccult negative. She does have hemorrhoids. I will treat her hemorrhoids and gout and have her f/u closely with PMD. Advised on strict return precautions. Dr. Roslynn Amble was also consulted on for this patient and agrees with plan.   [KM]    Clinical Course User Index [KM] Kristine Royal   MDM Rules/Calculators/A&P                     Based on review of vitals, medical screening exam, lab work and/or imaging, there does not appear to be an acute, emergent etiology for the patient's symptoms. Counseled pt on good return precautions and encouraged both PCP and ED follow-up as needed.  Prior to discharge, I also discussed incidental imaging findings with patient in detail and advised appropriate, recommended follow-up in detail.  Clinical Impression: 1. Acute gout of foot, unspecified cause, unspecified laterality   2. Hemorrhoids, unspecified hemorrhoid type   3. Dyspnea, unspecified type     Disposition: Discharge  Prior to providing a prescription for a controlled substance, I independently reviewed the patient's recent prescription history on the [facility]. The patient had no recent or regular prescriptions and was deemed  appropriate for a brief, less than 3 day prescription of narcotic for acute analgesia.  This note was prepared with assistance of Systems analyst. Occasional wrong-word or sound-a-like substitutions may have occurred due to the inherent limitations of voice recognition software.  Final Clinical Impression(s) / ED Diagnoses Final diagnoses:  Acute gout of  foot, unspecified cause, unspecified laterality  Hemorrhoids, unspecified hemorrhoid type  Dyspnea, unspecified type    Rx / DC Orders ED Discharge Orders         Ordered    hydrocortisone (ANUSOL-HC) 25 MG suppository  2 times daily     01/10/19 2252    indomethacin (INDOCIN) 25 MG capsule  3 times daily PRN     01/10/19 2252           Alveria Apley, PA-C 01/10/19 2255    Alveria Apley, PA-C 01/10/19 2258    Lucrezia Starch, MD 01/11/19 1316

## 2019-01-10 NOTE — Discharge Instructions (Addendum)
You were seen today for a few different problems. I think you have gout in your feet which is improving. I will prescribe you indomethacin for acute pain now but please follow up with your regular doctor as you may need a daily medication called allopurinol to prevent flare ups in the future since dietary changes aren't working. Your stool sample was negative for blood but you did have hemorrhoids on exam so I will send and prescription for some cream to the pharmacy., We checked several labs and EKG and chest xray to screen for heart and lung problems including heart attack and blood clots in your lungs and all the workup was reassuring. You should follow up closely with your primary care provider soon. Thank you for allowing me to care for you today. Please return to the emergency department if you have new or worsening symptoms. Take your medications as instructed.

## 2019-03-07 ENCOUNTER — Ambulatory Visit: Payer: Medicare Other | Attending: Internal Medicine

## 2019-03-07 DIAGNOSIS — Z20822 Contact with and (suspected) exposure to covid-19: Secondary | ICD-10-CM

## 2019-03-08 LAB — NOVEL CORONAVIRUS, NAA: SARS-CoV-2, NAA: NOT DETECTED

## 2019-10-01 ENCOUNTER — Encounter (HOSPITAL_COMMUNITY): Payer: Self-pay | Admitting: *Deleted

## 2019-10-01 ENCOUNTER — Other Ambulatory Visit: Payer: Self-pay

## 2019-10-01 ENCOUNTER — Ambulatory Visit (HOSPITAL_COMMUNITY)
Admission: EM | Admit: 2019-10-01 | Discharge: 2019-10-01 | Disposition: A | Discharge status: Home / Self Care | Payer: Medicare Other | Attending: Emergency Medicine | Admitting: Emergency Medicine

## 2019-10-01 DIAGNOSIS — B349 Viral infection, unspecified: Secondary | ICD-10-CM | POA: Diagnosis not present

## 2019-10-01 DIAGNOSIS — Z20822 Contact with and (suspected) exposure to covid-19: Secondary | ICD-10-CM | POA: Diagnosis present

## 2019-10-01 NOTE — ED Provider Notes (Signed)
Natasha Lopez    CSN: [id number] Arrival date & time: 10/01/19  1624      History   Chief Complaint Chief Complaint  Patient presents with  . Cough  . Abdominal Pain  . Covid Exposure    HPI Natasha Lopez is a 44 y.o. female.   Patient presents with nonproductive cough and abdominal pain x1 day.  She also reports nausea.  No abdominal pain currently.  She denies fever, chills, vomiting, diarrhea, shortness of breath, or other symptoms.  No treatments attempted at home.  Patient's daughter tested positive for Covid on 09/29/2019.  Patient's medical history includes diabetes, hypertension, migraine, PCOS, depression.  The history is provided by the patient.    Past Medical History:  Diagnosis Date  . Diabetes mellitus   . Hypertension   . 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 Migraine, unspecified, without mention of intractable migraine without mention of status migrainosus 09/09/2012  . PCOS (polycystic ovarian syndrome)     Patient Active Problem List   Diagnosis Date Noted  . Cutaneous skin tags 09/20/2012  . Healthcare maintenance 09/20/2012  . Migraine, unspecified, without mention of intractable migraine without mention of status migrainosus 09/09/2012  . Adnexal cyst, left 09/09/2012  . Adenoma of left adrenal gland 09/09/2012  . DIABETES MELLITUS, TYPE II, CONTROLLED 01/16/2009  . SLEEP APNEA, OBSTRUCTIVE 03/31/2007  . ESSENTIAL HYPERTENSION, BENIGN 03/31/2007  . POLYCYSTIC OVARY 03/04/2006  . OBESITY, NOS 03/04/2006  . DEPRESSION, MAJOR, WITH PSYCHOTIC BEHAVIOR 03/04/2006  . CARPAL TUNNEL SYNDROME 03/04/2006  . HIRSUTISM 03/04/2006    Past Surgical History:  Procedure Laterality Date  . CHOLECYSTECTOMY  2007  . HERNIA REPAIR  1980    OB History   No obstetric history on file.      Home Medications    Prior to Admission medications   Medication Sig Start Date End Date Taking? Authorizing Provider    chlorthalidone (HYGROTON) 25 MG tablet Take 1 tablet (25 mg total) by mouth daily. 12/22/12  Yes McDiarmid, Blane Ohara, MD  acetaminophen (TYLENOL) 500 MG tablet Take 2 tablets (1,000 mg total) by mouth 3 (three) times daily. 09/20/12   McDiarmid, Blane Ohara, MD  hydrocortisone (ANUSOL-HC) 25 MG suppository Place 1 suppository (25 mg total) rectally 2 (two) times daily. 01/10/19   Madilyn Hook A, PA-C  omega-3 acid ethyl esters (LOVAZA) 1 G capsule Take 1 g by mouth daily.    [provider]    Family History Family History  Problem Relation Age of Onset  . Alcohol abuse Father   . Depression Father   . Drug abuse Father   . Asthma Mother   . Depression Mother   . Hypertension Mother   . Depression Brother     Social History Social History   Tobacco Use  . Smoking status: Former Smoker    Quit date: 08/09/2008    Years since quitting: 11.1  . Smokeless tobacco: Never Used  Substance Use Topics  . Alcohol use: Yes    Comment:  occasional, ~ once a year.   . Drug use: No     Allergies   Coconut oil   Review of Systems Review of Systems  Constitutional: Negative for chills and fever.  HENT: Negative for ear pain and sore throat.   Eyes: Negative for pain and visual disturbance.  Respiratory: Positive for cough. Negative for shortness of breath.   Cardiovascular: Negative for chest pain and palpitations.  Gastrointestinal: Positive for abdominal pain and nausea. Negative for diarrhea and vomiting.  Genitourinary: Negative for dysuria and hematuria.  Musculoskeletal: Negative for arthralgias and back pain.  Skin: Negative for color change and rash.  Neurological: Negative for seizures and syncope.  All other systems reviewed and are negative.    Physical Exam Triage Vital Signs ED Triage Vitals  Enc Vitals Group     BP      Pulse      Resp      Temp      Temp src      SpO2      Weight      Height      Head Circumference      Peak Flow      Pain Score       Pain Loc      Pain Edu?      Excl. in [place]?    No data found.  Updated Vital Signs BP 136/83 (BP Location: Left Arm)   Pulse (!) 107   Temp 99.5 F (37.5 C) (Oral)   Resp 20   Ht 5\' 5"  (1.651 m)   Wt (!) 330 lb (149.7 kg)   LMP 07/31/2019   SpO2 99%   BMI 54.91 kg/m   Visual Acuity Right Eye Distance:   Left Eye Distance:   Bilateral Distance:    Right Eye Near:   Left Eye Near:    Bilateral Near:     Physical Exam Vitals and nursing note reviewed.  Constitutional:      General: She is not in acute distress.    Appearance: She is well-developed. She is obese. She is not ill-appearing.  HENT:     Head: Normocephalic and atraumatic.     Right Ear: Tympanic membrane normal.     Left Ear: Tympanic membrane normal.     Nose: Nose normal.     Mouth/Throat:     Mouth: Mucous membranes are moist.     Pharynx: Oropharynx is clear.  Eyes:     Conjunctiva/sclera: Conjunctivae normal.  Cardiovascular:     Rate and Rhythm: Normal rate and regular rhythm.     Heart sounds: No murmur heard.   Pulmonary:     Effort: Pulmonary effort is normal. No respiratory distress.     Breath sounds: Normal breath sounds.  Abdominal:     Palpations: Abdomen is soft.     Tenderness: There is no abdominal tenderness. There is no guarding or rebound.  Musculoskeletal:     Cervical back: Neck supple.  Skin:    General: Skin is warm and dry.     Findings: No rash.  Neurological:     General: No focal deficit present.     Mental Status: She is alert and oriented to person, place, and time.     Gait: Gait normal.  Psychiatric:        Mood and Affect: Mood normal.        Behavior: Behavior normal.      UC Treatments / Results  Labs (all labs ordered are listed, but only abnormal results are displayed) Labs Reviewed  SARS CORONAVIRUS 2 (TAT 6-24 HRS)    EKG   Radiology No results found.  Procedures Procedures (including critical care time)  Medications Ordered in  UC Medications - No data to display  Initial Impression / Assessment and Plan / UC Course  I have reviewed the triage vital signs and the nursing notes.  Pertinent labs &  imaging results that were available during my care of the patient were reviewed by me and considered in my medical decision making (see chart for details).   Exposure to COVID-19 virus, viral illness.  Patient is well-appearing and her exam is reassuring.  PCR COVID pending.  Instructed patient to self quarantine until the test result is back.  Discussed symptomatic treatment including Tylenol, rest, hydration.  Instructed patient to go to the ED if she has acute worsening symptoms.  Patient agrees to plan of care.    Final Clinical Impressions(s) / UC Diagnoses   Final diagnoses:  Exposure to COVID-19 virus  Viral illness     Discharge Instructions     Your COVID test is pending.  You should self quarantine until the test result is back.    Take Tylenol as needed for fever or discomfort.  Rest and keep yourself hydrated.    Go to the emergency department if you develop acute worsening symptoms.        ED Prescriptions    None     PDMP not reviewed this encounter.   Sharion Balloon, NP 10/01/19 1810

## 2019-10-01 NOTE — ED Triage Notes (Signed)
PT reports ABD pain and cough since yesterday. Pt has nausea but no vomiting. Pt reports her other daughter tested positive for COVID Friday.

## 2019-10-01 NOTE — Discharge Instructions (Signed)
Your COVID test is pending.  You should self quarantine until the test result is back.    Take Tylenol as needed for fever or discomfort.  Rest and keep yourself hydrated.    Go to the emergency department if you develop acute worsening symptoms.     

## 2019-10-02 LAB — SARS CORONAVIRUS 2 (TAT 6-24 HRS): SARS Coronavirus 2: POSITIVE — AB

## 2019-10-03 ENCOUNTER — Encounter: Payer: Self-pay | Admitting: Nurse Practitioner

## 2019-10-03 ENCOUNTER — Other Ambulatory Visit: Payer: Self-pay | Admitting: Nurse Practitioner

## 2019-10-03 NOTE — Progress Notes (Signed)
COVID [facility] Contact Note   Qualifiers: DM, HTN Symptoms: Cough, headache, fatigue, body aches, chills, fever Symptom Onset: 2 days   Contact via telephone to discuss symptoms and evaluate for interest and qualifications for [facility] treatment. Patient qualifies for at-risk group according to the FDA Emergency Use Authorization.   Spoke with patient and she would like to speak with her husband to discuss the treatment. MyChart message sent with direct return contact or [facility] hotline info.   Worthy Keeler, DNP, AGNP-c COVID-19 [facility] [phone]

## 2019-10-04 ENCOUNTER — Other Ambulatory Visit: Payer: Self-pay | Admitting: Nurse Practitioner

## 2019-10-04 DIAGNOSIS — Z6841 Body Mass Index (BMI) 40.0 and over, adult: Secondary | ICD-10-CM

## 2019-10-04 DIAGNOSIS — U071 COVID-19: Secondary | ICD-10-CM | POA: Insufficient documentation

## 2019-10-04 DIAGNOSIS — I10 Essential (primary) hypertension: Secondary | ICD-10-CM

## 2019-10-04 DIAGNOSIS — G4733 Obstructive sleep apnea (adult) (pediatric): Secondary | ICD-10-CM

## 2019-10-04 DIAGNOSIS — E1149 Type 2 diabetes mellitus with other diabetic neurological complication: Secondary | ICD-10-CM

## 2019-10-04 NOTE — Progress Notes (Signed)
I connected by phone with Natasha Lopez on 10/04/2019 at 11:21 AM to discuss the potential use of a new treatment for mild to moderate COVID-19 viral infection in non-hospitalized patients.  This patient is a 44 y.o. female that meets the FDA criteria for Emergency Use Authorization of COVID monoclonal antibody casirivimab/imdevimab or bamlanivimab/eteseviamb.  Has a (+) direct SARS-CoV-2 viral test result  Has mild or moderate COVID-19   Is NOT hospitalized due to COVID-19  Is within 10 days of symptom onset  Has at least one of the high risk factor(s) for progression to severe COVID-19 and/or hospitalization as defined in EUA.  Specific high risk criteria : BMI > 25, Diabetes, Cardiovascular disease or hypertension, Chronic Lung Disease and Other high risk medical condition per CDC:  SVI   I have spoken and communicated the following to the patient or parent/caregiver regarding COVID monoclonal antibody treatment:  1. FDA has authorized the emergency use for the treatment of mild to moderate COVID-19 in adults and pediatric patients with positive results of direct SARS-CoV-2 viral testing who are 29 years of age and older weighing at least 40 kg, and who are at high risk for progressing to severe COVID-19 and/or hospitalization.  2. The significant known and potential risks and benefits of COVID monoclonal antibody, and the extent to which such potential risks and benefits are unknown.  3. Information on available alternative treatments and the risks and benefits of those alternatives, including clinical trials.  4. Patients treated with COVID monoclonal antibody should continue to self-isolate and use infection control measures (e.g., wear mask, isolate, social distance, avoid sharing personal items, clean and disinfect "high touch" surfaces, and frequent handwashing) according to CDC guidelines.   5. The patient or parent/caregiver has the option to accept or refuse COVID monoclonal  antibody treatment.  After reviewing this information with the patient, the patient has agreed to receive one of the available covid 19 monoclonal antibodies and will be provided an appropriate fact sheet prior to infusion. [name], NP 10/04/2019 11:21 AM

## 2019-10-05 ENCOUNTER — Ambulatory Visit (HOSPITAL_COMMUNITY)
Admission: RE | Admit: 2019-10-05 | Discharge: 2019-10-05 | Disposition: A | Discharge status: Home / Self Care | Payer: Medicare Other | Source: Ambulatory Visit | Attending: Pulmonary Disease | Admitting: Pulmonary Disease

## 2019-10-05 DIAGNOSIS — U071 COVID-19: Secondary | ICD-10-CM | POA: Insufficient documentation

## 2019-10-05 DIAGNOSIS — Z6841 Body Mass Index (BMI) 40.0 and over, adult: Secondary | ICD-10-CM

## 2019-10-05 DIAGNOSIS — Z23 Encounter for immunization: Secondary | ICD-10-CM | POA: Diagnosis not present

## 2019-10-05 DIAGNOSIS — G4733 Obstructive sleep apnea (adult) (pediatric): Secondary | ICD-10-CM | POA: Insufficient documentation

## 2019-10-05 DIAGNOSIS — I10 Essential (primary) hypertension: Secondary | ICD-10-CM | POA: Insufficient documentation

## 2019-10-05 MED ORDER — SODIUM CHLORIDE 0.9 % IV SOLN: INTRAVENOUS | Status: DC | PRN

## 2019-10-05 MED ORDER — EPINEPHRINE 0.3 MG/0.3ML IJ SOAJ: 0.3000 mg | Freq: Once | INTRAMUSCULAR | Status: DC | PRN

## 2019-10-05 MED ORDER — DIPHENHYDRAMINE HCL 50 MG/ML IJ SOLN: 50.0000 mg | Freq: Once | INTRAMUSCULAR | Status: DC | PRN

## 2019-10-05 MED ORDER — METHYLPREDNISOLONE SODIUM SUCC 125 MG IJ SOLR: 125.0000 mg | Freq: Once | INTRAMUSCULAR | Status: DC | PRN

## 2019-10-05 MED ORDER — SODIUM CHLORIDE 0.9 % IV SOLN
1200.0000 mg | Freq: Once | INTRAVENOUS | Status: AC
  Administered 2019-10-05: 1200 mg via INTRAVENOUS

## 2019-10-05 MED ORDER — FAMOTIDINE IN NACL 20-0.9 MG/50ML-% IV SOLN: 20.0000 mg | Freq: Once | INTRAVENOUS | Status: DC | PRN

## 2019-10-05 MED ORDER — ALBUTEROL SULFATE HFA 108 (90 BASE) MCG/ACT IN AERS: 2.0000 | INHALATION_SPRAY | Freq: Once | RESPIRATORY_TRACT | Status: DC | PRN

## 2019-10-05 NOTE — Discharge Instructions (Signed)

## 2019-10-05 NOTE — Progress Notes (Signed)
  Diagnosis: COVID-19  Physician: Dr. Asencion Noble  Procedure: [facility]fusion [facility] Med: casirivimab\imdevimab infusion - Provided patient with casirivimab\imdevimab fact sheet for patients, parents and caregivers prior to infusion.  Complications: No immediate complications noted.  Discharge: Discharged home   Rejeana Brock 10/05/2019

## 2020-02-17 ENCOUNTER — Emergency Department (HOSPITAL_COMMUNITY)
Admission: EM | Admit: 2020-02-17 | Discharge: 2020-02-17 | Disposition: A | Discharge status: Home / Self Care | Payer: Medicare Other | Attending: Emergency Medicine | Admitting: Emergency Medicine

## 2020-02-17 ENCOUNTER — Other Ambulatory Visit: Payer: Self-pay

## 2020-02-17 ENCOUNTER — Emergency Department (HOSPITAL_COMMUNITY): Payer: Medicare Other

## 2020-02-17 DIAGNOSIS — R531 Weakness: Secondary | ICD-10-CM | POA: Insufficient documentation

## 2020-02-17 DIAGNOSIS — R5383 Other fatigue: Secondary | ICD-10-CM | POA: Insufficient documentation

## 2020-02-17 DIAGNOSIS — R0789 Other chest pain: Secondary | ICD-10-CM | POA: Diagnosis not present

## 2020-02-17 DIAGNOSIS — Z79899 Other long term (current) drug therapy: Secondary | ICD-10-CM | POA: Diagnosis not present

## 2020-02-17 DIAGNOSIS — R6883 Chills (without fever): Secondary | ICD-10-CM | POA: Diagnosis not present

## 2020-02-17 DIAGNOSIS — R002 Palpitations: Secondary | ICD-10-CM | POA: Diagnosis not present

## 2020-02-17 DIAGNOSIS — M542 Cervicalgia: Secondary | ICD-10-CM | POA: Diagnosis not present

## 2020-02-17 DIAGNOSIS — J9811 Atelectasis: Secondary | ICD-10-CM | POA: Diagnosis not present

## 2020-02-17 DIAGNOSIS — E1149 Type 2 diabetes mellitus with other diabetic neurological complication: Secondary | ICD-10-CM | POA: Insufficient documentation

## 2020-02-17 DIAGNOSIS — I10 Essential (primary) hypertension: Secondary | ICD-10-CM | POA: Insufficient documentation

## 2020-02-17 DIAGNOSIS — Z8616 Personal history of COVID-19: Secondary | ICD-10-CM | POA: Insufficient documentation

## 2020-02-17 DIAGNOSIS — Z20822 Contact with and (suspected) exposure to covid-19: Secondary | ICD-10-CM | POA: Insufficient documentation

## 2020-02-17 DIAGNOSIS — Z87891 Personal history of nicotine dependence: Secondary | ICD-10-CM | POA: Diagnosis not present

## 2020-02-17 DIAGNOSIS — R0602 Shortness of breath: Secondary | ICD-10-CM | POA: Diagnosis not present

## 2020-02-17 LAB — BASIC METABOLIC PANEL
Anion gap: 12 (ref 5–15)
BUN: 11 mg/dL (ref 6–20)
CO2: 25 mmol/L (ref 22–32)
Calcium: 9.3 mg/dL (ref 8.9–10.3)
Chloride: 99 mmol/L (ref 98–111)
Creatinine, Ser: 0.96 mg/dL (ref 0.44–1.00)
GFR, Estimated: >60 mL/min (ref >60)
Glucose, Bld: 237 mg/dL — ABNORMAL HIGH (ref 70–99)
Potassium: 3.9 mmol/L (ref 3.5–5.1)
Sodium: 136 mmol/L (ref 135–145)

## 2020-02-17 LAB — TROPONIN I (HIGH SENSITIVITY)
Troponin I (High Sensitivity): 5 ng/L (ref <18)
Troponin I (High Sensitivity): 5 ng/L (ref <18)

## 2020-02-17 LAB — CBC
HCT: 40.9 % (ref 36.0–46.0)
Hemoglobin: 12.7 g/dL (ref 12.0–15.0)
MCH: 28.3 pg (ref 26.0–34.0)
MCHC: 31.1 g/dL (ref 30.0–36.0)
MCV: 91.1 fL (ref 80.0–100.0)
Platelets: 397 10*3/uL (ref 150–400)
RBC: 4.49 MIL/uL (ref 3.87–5.11)
RDW: 13.7 % (ref 11.5–15.5)
WBC: 10.7 10*3/uL — ABNORMAL HIGH (ref 4.0–10.5)
nRBC: 0 % (ref 0.0–0.2)

## 2020-02-17 LAB — TSH: TSH: 3.614 u[IU]/mL (ref 0.350–4.500)

## 2020-02-17 LAB — RESP PANEL BY RT-PCR (FLU A&B, COVID) ARPGX2
Influenza A by PCR: NEGATIVE
Influenza B by PCR: NEGATIVE
SARS Coronavirus 2 by RT PCR: NEGATIVE

## 2020-02-17 LAB — D-DIMER, QUANTITATIVE: D-Dimer, Quant: 0.3 ug/mL-FEU (ref 0.00–0.50)

## 2020-02-17 MED ORDER — ALBUTEROL SULFATE HFA 108 (90 BASE) MCG/ACT IN AERS: 2.0000 | INHALATION_SPRAY | RESPIRATORY_TRACT | Status: DC | PRN

## 2020-02-17 NOTE — ED Triage Notes (Signed)
Pt presents to ED POV. Pt c/o SOB and x2 days. Pt reports that she has been dyspneic at rest. Pt reports that she also had some palpitations at rest that concerned her. And reports intermittent chest pressure that radiates towards R shoulder.

## 2020-02-17 NOTE — Discharge Instructions (Addendum)
Get help right away if: °Your shortness of breath gets worse. °You have shortness of breath when you are resting. °You feel light-headed or you faint. °You have a cough that is not controlled with medicines. °You cough up blood. °You have pain with breathing. °You have pain in your chest, arms, shoulders, or abdomen. °You have a fever. °You cannot walk up stairs or exercise the way that you normally do. °

## 2020-02-17 NOTE — ED Notes (Signed)
Pt ambulated in the hall with no issues.

## 2020-02-17 NOTE — ED Provider Notes (Signed)
[facility] EMERGENCY DEPARTMENT Provider Note   CSN: [id number] Arrival date & time: 02/17/20  7001     History Chief Complaint  Patient presents with  . Shortness of Breath    Natasha Lopez is a 45 y.o. female who presents emergency department for chief complaint of shortness of breath.  Patient states that her symptoms began 2 days ago.  She was feeling significant malaise and shortness of breath, worse with exertion, better with rest.  She denies orthopnea, PND, unilateral leg swelling or pain, history of DVT or PE, history of cancer, pleuritic chest pain, use of exogenous estrogens, recent surgery or confinement, hemoptysis.  Patient states that she has had associated chest pressure and feelings of presyncope.  She has also had associated hot and cold chills.  She is not vaccinated against the coronavirus.  She has had some pain in the right side of her neck intermittently, she has had some intermittent chest pressure at rest and last night noticed palpitations which made her feel very short of breath.  She has never had anything like this before.  She denies a history of thyroid dysfunction but does have a strong family history of thyroid cancer.   Shortness of Breath Severity:  Severe Onset quality:  Gradual Duration:  2 days Timing:  Intermittent Progression:  Improving Chronicity:  New Relieved by:  Nothing Worsened by:  Activity and exertion Ineffective treatments:  None tried Associated symptoms: neck pain   Associated symptoms: no abdominal pain, no chest pain, no claudication, no cough, no diaphoresis, no ear pain, no fever, no headaches, no hemoptysis, no PND, no rash, no syncope, no swollen glands, no vomiting and no wheezing   Risk factors: obesity   Risk factors: no recent alcohol use, no family hx of DVT, no hx of cancer, no hx of PE/DVT, no oral contraceptive use, no prolonged immobilization, no recent surgery and no tobacco use    Palpitations Palpitations quality:  Unable to specify Onset quality:  At rest Timing:  Intermittent Progression:  Improving Chronicity:  New Context: not appetite suppressants, not blood loss, not bronchodilators, not caffeine, not dehydration, not exercise, not illicit drugs, not nicotine and not stimulant use   Relieved by:  Nothing Worsened by:  Nothing Associated symptoms: chest pressure, malaise/fatigue, shortness of breath and weakness   Associated symptoms: no chest pain, no cough, no diaphoresis, no hemoptysis, no leg pain, no lower extremity edema, no nausea, no PND, no syncope and no vomiting   Risk factors: diabetes mellitus   Risk factors: no heart disease, no hx of atrial fibrillation, no hx of DVT, no hx of PE, no hx of thyroid disease, no hypercoagulable state, no hyperthyroidism, no OTC sinus medications and no stress     HPI: A 45 year old patient with a history of treated diabetes, hypertension and obesity presents for evaluation of chest pain. Initial onset of pain was approximately 3-6 hours ago. The patient's chest pain is described as heaviness/pressure/tightness and is worse with exertion. The patient's chest pain is middle- or left-sided, is not well-localized, is not sharp and does not radiate to the arms/jaw/neck. The patient does not complain of nausea and denies diaphoresis. The patient has no history of stroke, has no history of peripheral artery disease, has not smoked in the past 90 days, has no relevant family history of coronary artery disease (first degree relative at less than age 75) and has no history of hypercholesterolemia.   Past Medical History:  Diagnosis Date  .  Diabetes mellitus   . Hypertension   . 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 Migraine, unspecified, without mention of intractable migraine without mention of status migrainosus 09/09/2012  . PCOS (polycystic ovarian syndrome)     Patient Active Problem List    Diagnosis Date Noted  . COVID-19 10/04/2019  . Cutaneous skin tags 09/20/2012  . Healthcare maintenance 09/20/2012  . Migraine, unspecified, without mention of intractable migraine without mention of status migrainosus 09/09/2012  . Adnexal cyst, left 09/09/2012  . Adenoma of left adrenal gland 09/09/2012  . Type 2 diabetes mellitus with neurological complications ([place]) 27/74/1287  . SLEEP APNEA, OBSTRUCTIVE 03/31/2007  . ESSENTIAL HYPERTENSION, BENIGN 03/31/2007  . POLYCYSTIC OVARY 03/04/2006  . OBESITY, NOS 03/04/2006  . DEPRESSION, MAJOR, WITH PSYCHOTIC BEHAVIOR 03/04/2006  . CARPAL TUNNEL SYNDROME 03/04/2006  . HIRSUTISM 03/04/2006    Past Surgical History:  Procedure Laterality Date  . CHOLECYSTECTOMY  2007  . HERNIA REPAIR  1980     OB History   No obstetric history on file.     Family History  Problem Relation Age of Onset  . Alcohol abuse Father   . Depression Father   . Drug abuse Father   . Asthma Mother   . Depression Mother   . Hypertension Mother   . Depression Brother     Social History   Tobacco Use  . Smoking status: Former Smoker    Quit date: 08/09/2008    Years since quitting: 11.5  . Smokeless tobacco: Never Used  Substance Use Topics  . Alcohol use: Yes    Comment:  occasional, ~ once a year.   . Drug use: No    Home Medications Prior to Admission medications   Medication Sig Start Date End Date Taking? Authorizing Provider  acetaminophen (TYLENOL) 500 MG tablet Take 2 tablets (1,000 mg total) by mouth 3 (three) times daily. 09/20/12   McDiarmid, Blane Ohara, MD  chlorthalidone (HYGROTON) 25 MG tablet Take 1 tablet (25 mg total) by mouth daily. 12/22/12   McDiarmid, Blane Ohara, MD  hydrocortisone (ANUSOL-HC) 25 MG suppository Place 1 suppository (25 mg total) rectally 2 (two) times daily. 01/10/19   Madilyn Hook A, PA-C  omega-3 acid ethyl esters (LOVAZA) 1 G capsule Take 1 g by mouth daily.    [provider]    Allergies    Coconut  oil  Review of Systems   Review of Systems  Constitutional: Positive for activity change, chills and malaise/fatigue. Negative for diaphoresis and fever.  HENT: Negative for ear pain.   Respiratory: Positive for shortness of breath. Negative for cough, hemoptysis and wheezing.   Cardiovascular: Positive for palpitations. Negative for chest pain, claudication, syncope and PND.  Gastrointestinal: Negative for abdominal pain, nausea and vomiting.  Endocrine: Negative for cold intolerance and heat intolerance.  Genitourinary: Negative.   Musculoskeletal: Positive for neck pain.  Skin: Negative for rash.  Neurological: Positive for weakness. Negative for headaches.  Hematological: Negative.   Psychiatric/Behavioral: Negative.     Physical Exam Updated Vital Signs BP (!) 142/99   Pulse 93   Temp 98.4 F (36.9 C) (Oral)   Resp 19   SpO2 100%   Physical Exam Vitals and nursing note reviewed.  Constitutional:      General: She is not in acute distress.    Appearance: She is well-developed and well-nourished. She is not diaphoretic.     Comments: Elevated BMI  HENT:  Head: Normocephalic and atraumatic.  Eyes:     General: No scleral icterus.    Extraocular Movements: Extraocular movements intact.     Conjunctiva/sclera: Conjunctivae normal.     Pupils: Pupils are equal, round, and reactive to light.  Cardiovascular:     Rate and Rhythm: Normal rate and regular rhythm.     Heart sounds: Normal heart sounds. No murmur heard. No friction rub. No gallop.   Pulmonary:     Effort: Pulmonary effort is normal. No respiratory distress.     Breath sounds: Normal breath sounds. No decreased breath sounds, wheezing, rhonchi or rales.  Abdominal:     General: Bowel sounds are normal. There is no distension.     Palpations: Abdomen is soft. There is no mass.     Tenderness: There is no abdominal tenderness. There is no guarding.  Musculoskeletal:     Cervical back: Normal range of  motion.     Right lower leg: No edema.     Left lower leg: No edema.  Skin:    General: Skin is warm and dry.  Neurological:     Mental Status: She is alert and oriented to person, place, and time.  Psychiatric:        Behavior: Behavior normal.     ED Results / Procedures / Treatments   Labs (all labs ordered are listed, but only abnormal results are displayed) Labs Reviewed  BASIC METABOLIC PANEL - Abnormal; Notable for the following components:      Result Value   Glucose, Bld 237 (*)    All other components within normal limits  CBC - Abnormal; Notable for the following components:   WBC 10.7 (*)    All other components within normal limits  RESP PANEL BY RT-PCR (FLU A&B, COVID) [id number]  D-DIMER, QUANTITATIVE (NOT AT [facility])  TSH  TROPONIN I (HIGH SENSITIVITY)  TROPONIN I (HIGH SENSITIVITY)    EKG EKG Interpretation  Date/Time:  Saturday February 17 2020 02:21:52 EST Ventricular Rate:  98 PR Interval:  164 QRS Duration: 88 QT Interval:  338 QTC Calculation: 431 R Axis:   60 Text Interpretation: Normal sinus rhythm T wave abnormality, consider inferior ischemia Abnormal ECG No significant change since last tracing Confirmed by Isla Pence [phone]) on 02/17/2020 7:57:31 AM   Radiology DG Chest 2 View  Result Date: 02/17/2020 CLINICAL DATA:  Shortness of breath for 2 days. EXAM: CHEST - 2 VIEW COMPARISON:  01/10/2019 FINDINGS: Lung volumes are lowthe cardiomediastinal contours are normal. Subsegmental atelectasis in the lung bases. Pulmonary vasculature is normal. No consolidation, pleural effusion, or pneumothorax. No acute osseous abnormalities are seen. IMPRESSION: Low lung volumes with bibasilar atelectasis. Electronically Signed   By: Keith Rake M.D.   On: 02/17/2020 03:16    Procedures Procedures   Medications Ordered in ED Medications  albuterol (VENTOLIN HFA) 108 (90 Base) MCG/ACT inhaler 2 puff (has no administration in time range)    ED Course   I have reviewed the triage vital signs and the nursing notes.  Pertinent labs & imaging results that were available during my care of the patient were reviewed by me and considered in my medical decision making (see chart for details).  Clinical Course as of 02/17/20 0817  Sat Feb 17, 2020  0749 BMI 54.8 [AH]  0752 ED ECG REPORT   Date: @EDTODAY @  Rate: 98  Rhythm: normal sinus rhythm  QRS Axis: normal  Intervals: normal  ST/T Wave abnormalities: nonspecific T wave changes  Conduction Disutrbances:none  Narrative Interpretation:    I have personally reviewed the EKG tracing and agree with the computerized printout as noted.  [AH]    Clinical Course User Index [AH] Margarita Mail, PA-C   MDM Rules/Calculators/A&P HEAR Score: 4                        IR:JJOACZYSA of breath/palpitations VS: BP (!) 156/104   Pulse 89   Temp 98.4 F (36.9 C) (Oral)   Resp (!) 26   SpO2 100%   YT:KZSWFUX is gathered by patient and emr. Previous records obtained and reviewed. DDX:The patient's complaint of shortness of breath and palpitations involves an extensive number of diagnostic and treatment options, and is a complaint that carries with it a high risk of complications, morbidity, and potential mortality. Given the large differential diagnosis, medical decision making is of high complexity. The emergent differential diagnosis for shortness of breath includes, but is not limited to, Pulmonary edema, bronchoconstriction, Pneumonia, Pulmonary embolism, Pneumotherax/ Hemothorax, Dysrythmia, ACS.  The differential diagnosis for palpitations includes cardiac arrhythmias, PVC/PAC, ACS, Cardiomyopathy, CHF, MVP, pericarditis, valvular disease, Panic/Anxiety, Somatic disorder, ETOH, Caffeine,  Stimulant use, medication side effect, Anemia, Hyperthyroidism, pulmonary embolism.  Labs: I ordered reviewed and interpreted labs which included CBC with mildly elevated leukocytosis of insignificant  value BMP with mildly elevated glucose Covid/flu negative Troponin and d-dimer negative tsh wnl  Imaging: I ordered and reviewed images which included 2 v cxr. I independently visualized and interpreted all imaging. There are no acute, significant findings on today's images. EKG:nsr rate of 98  Consults: NAT:FTDDUKG without evidence of PE, ACS, Hyperthyroid, Pulmonary edema, PTX, or other emergent cause of sxs. She has a hx sig for high BMI, sleep apnea (compliant with CPAP nightly) and htn which can certainly put her at higher risk of development of pulm HTN or other structural hemodynamic changes that warrant further evaluation in the OP setting. She was able to ambulate here in the ER with pulse of 90 and 02 saturation of 100%. Referred to PCP in follow up . Patient disposition:The patient appears reasonably screened and/or stabilized for discharge and I doubt any other medical condition or other [facility] requiring further screening, evaluation, or treatment in the ED at this time prior to discharge. I have discussed lab and/or imaging findings with the patient and answered all questions/concerns to the best of my ability.I have discussed return precautions and OP follow up.    Final Clinical Impression(s) / ED Diagnoses Final diagnoses:  None    Rx / DC Orders ED Discharge Orders    None       Margarita Mail, PA-C 02/17/20 1130    Isla Pence, MD 02/17/20 1130

## 2020-03-14 DIAGNOSIS — Z20822 Contact with and (suspected) exposure to covid-19: Secondary | ICD-10-CM | POA: Diagnosis not present

## 2020-05-28 DIAGNOSIS — Z7984 Long term (current) use of oral hypoglycemic drugs: Secondary | ICD-10-CM | POA: Diagnosis not present

## 2020-05-28 DIAGNOSIS — M25562 Pain in left knee: Secondary | ICD-10-CM | POA: Diagnosis not present

## 2020-05-28 DIAGNOSIS — Z1389 Encounter for screening for other disorder: Secondary | ICD-10-CM | POA: Diagnosis not present

## 2020-05-28 DIAGNOSIS — M109 Gout, unspecified: Secondary | ICD-10-CM | POA: Diagnosis not present

## 2020-05-28 DIAGNOSIS — I10 Essential (primary) hypertension: Secondary | ICD-10-CM | POA: Diagnosis not present

## 2020-05-28 DIAGNOSIS — Z0000 Encounter for general adult medical examination without abnormal findings: Secondary | ICD-10-CM | POA: Diagnosis not present

## 2020-05-28 DIAGNOSIS — E1169 Type 2 diabetes mellitus with other specified complication: Secondary | ICD-10-CM | POA: Diagnosis not present

## 2020-07-24 DIAGNOSIS — M2242 Chondromalacia patellae, left knee: Secondary | ICD-10-CM | POA: Diagnosis not present

## 2020-07-24 DIAGNOSIS — M7652 Patellar tendinitis, left knee: Secondary | ICD-10-CM | POA: Diagnosis not present

## 2020-07-24 DIAGNOSIS — M25562 Pain in left knee: Secondary | ICD-10-CM | POA: Diagnosis not present

## 2020-10-04 DIAGNOSIS — I10 Essential (primary) hypertension: Secondary | ICD-10-CM | POA: Diagnosis not present

## 2020-10-04 DIAGNOSIS — E1169 Type 2 diabetes mellitus with other specified complication: Secondary | ICD-10-CM | POA: Diagnosis not present

## 2020-10-11 DIAGNOSIS — R7309 Other abnormal glucose: Secondary | ICD-10-CM | POA: Diagnosis not present

## 2021-03-05 DIAGNOSIS — E1169 Type 2 diabetes mellitus with other specified complication: Secondary | ICD-10-CM | POA: Diagnosis not present

## 2021-03-05 DIAGNOSIS — I10 Essential (primary) hypertension: Secondary | ICD-10-CM | POA: Diagnosis not present

## 2021-03-13 ENCOUNTER — Emergency Department (HOSPITAL_BASED_OUTPATIENT_CLINIC_OR_DEPARTMENT_OTHER)
Admission: EM | Admit: 2021-03-13 | Discharge: 2021-03-13 | Disposition: A | Discharge status: Home / Self Care | Payer: Medicare Other | Attending: Emergency Medicine | Admitting: Emergency Medicine

## 2021-03-13 ENCOUNTER — Other Ambulatory Visit: Payer: Self-pay

## 2021-03-13 ENCOUNTER — Encounter (HOSPITAL_BASED_OUTPATIENT_CLINIC_OR_DEPARTMENT_OTHER): Payer: Self-pay

## 2021-03-13 DIAGNOSIS — R519 Headache, unspecified: Secondary | ICD-10-CM | POA: Diagnosis present

## 2021-03-13 DIAGNOSIS — J705 Respiratory conditions due to smoke inhalation: Secondary | ICD-10-CM | POA: Insufficient documentation

## 2021-03-13 DIAGNOSIS — T59811A Toxic effect of smoke, accidental (unintentional), initial encounter: Secondary | ICD-10-CM | POA: Diagnosis not present

## 2021-03-13 DIAGNOSIS — R000 Tachycardia, unspecified: Secondary | ICD-10-CM | POA: Diagnosis not present

## 2021-03-13 MED ORDER — SILVER SULFADIAZINE 1 % EX CREA
TOPICAL_CREAM | Freq: Once | CUTANEOUS | Status: AC
  Filled 2021-03-13: qty 85

## 2021-03-13 MED ORDER — SILVER SULFADIAZINE 1 % EX CREA: 1.0000 "application " | TOPICAL_CREAM | Freq: Every day | CUTANEOUS | 0 refills | Status: AC
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
## 2021-03-25 DIAGNOSIS — I10 Essential (primary) hypertension: Secondary | ICD-10-CM | POA: Diagnosis not present

## 2021-03-25 DIAGNOSIS — G4723 Circadian rhythm sleep disorder, irregular sleep wake type: Secondary | ICD-10-CM | POA: Diagnosis not present

## 2021-03-25 DIAGNOSIS — G4733 Obstructive sleep apnea (adult) (pediatric): Secondary | ICD-10-CM | POA: Diagnosis not present

## 2021-04-03 DIAGNOSIS — E1169 Type 2 diabetes mellitus with other specified complication: Secondary | ICD-10-CM | POA: Diagnosis not present

## 2021-04-30 DIAGNOSIS — I10 Essential (primary) hypertension: Secondary | ICD-10-CM | POA: Diagnosis not present

## 2021-04-30 DIAGNOSIS — E1169 Type 2 diabetes mellitus with other specified complication: Secondary | ICD-10-CM | POA: Diagnosis not present

## 2021-05-04 DIAGNOSIS — I10 Essential (primary) hypertension: Secondary | ICD-10-CM | POA: Diagnosis not present

## 2021-05-04 DIAGNOSIS — E1169 Type 2 diabetes mellitus with other specified complication: Secondary | ICD-10-CM | POA: Diagnosis not present

## 2021-05-30 DIAGNOSIS — E1169 Type 2 diabetes mellitus with other specified complication: Secondary | ICD-10-CM | POA: Diagnosis not present

## 2021-05-30 DIAGNOSIS — I10 Essential (primary) hypertension: Secondary | ICD-10-CM | POA: Diagnosis not present

## 2021-06-13 DIAGNOSIS — E1169 Type 2 diabetes mellitus with other specified complication: Secondary | ICD-10-CM | POA: Diagnosis not present

## 2021-06-13 DIAGNOSIS — I10 Essential (primary) hypertension: Secondary | ICD-10-CM | POA: Diagnosis not present

## 2021-07-03 DIAGNOSIS — G4733 Obstructive sleep apnea (adult) (pediatric): Secondary | ICD-10-CM | POA: Diagnosis not present

## 2021-07-03 DIAGNOSIS — G4723 Circadian rhythm sleep disorder, irregular sleep wake type: Secondary | ICD-10-CM | POA: Diagnosis not present

## 2021-07-03 DIAGNOSIS — I10 Essential (primary) hypertension: Secondary | ICD-10-CM | POA: Diagnosis not present

## 2021-07-11 DIAGNOSIS — G4733 Obstructive sleep apnea (adult) (pediatric): Secondary | ICD-10-CM | POA: Diagnosis not present

## 2021-08-11 DIAGNOSIS — G4733 Obstructive sleep apnea (adult) (pediatric): Secondary | ICD-10-CM | POA: Diagnosis not present

## 2021-08-11 DIAGNOSIS — E1169 Type 2 diabetes mellitus with other specified complication: Secondary | ICD-10-CM | POA: Diagnosis not present

## 2021-08-11 DIAGNOSIS — I10 Essential (primary) hypertension: Secondary | ICD-10-CM | POA: Diagnosis not present

## 2021-09-11 DIAGNOSIS — G4733 Obstructive sleep apnea (adult) (pediatric): Secondary | ICD-10-CM | POA: Diagnosis not present

## 2021-10-03 DIAGNOSIS — E1169 Type 2 diabetes mellitus with other specified complication: Secondary | ICD-10-CM | POA: Diagnosis not present

## 2021-10-03 DIAGNOSIS — I10 Essential (primary) hypertension: Secondary | ICD-10-CM | POA: Diagnosis not present

## 2021-10-08 DIAGNOSIS — G4723 Circadian rhythm sleep disorder, irregular sleep wake type: Secondary | ICD-10-CM | POA: Diagnosis not present

## 2021-10-08 DIAGNOSIS — G4733 Obstructive sleep apnea (adult) (pediatric): Secondary | ICD-10-CM | POA: Diagnosis not present

## 2021-10-08 DIAGNOSIS — I10 Essential (primary) hypertension: Secondary | ICD-10-CM | POA: Diagnosis not present

## 2021-10-11 DIAGNOSIS — G4733 Obstructive sleep apnea (adult) (pediatric): Secondary | ICD-10-CM | POA: Diagnosis not present

## 2021-10-30 DIAGNOSIS — G4733 Obstructive sleep apnea (adult) (pediatric): Secondary | ICD-10-CM | POA: Diagnosis not present

## 2021-11-04 DIAGNOSIS — Z1211 Encounter for screening for malignant neoplasm of colon: Secondary | ICD-10-CM | POA: Diagnosis not present

## 2021-11-04 DIAGNOSIS — B3731 Acute candidiasis of vulva and vagina: Secondary | ICD-10-CM | POA: Diagnosis not present

## 2021-11-04 DIAGNOSIS — E1169 Type 2 diabetes mellitus with other specified complication: Secondary | ICD-10-CM | POA: Diagnosis not present

## 2021-11-04 DIAGNOSIS — M109 Gout, unspecified: Secondary | ICD-10-CM | POA: Diagnosis not present

## 2021-11-04 DIAGNOSIS — Z0000 Encounter for general adult medical examination without abnormal findings: Secondary | ICD-10-CM | POA: Diagnosis not present

## 2021-11-04 DIAGNOSIS — L680 Hirsutism: Secondary | ICD-10-CM | POA: Diagnosis not present

## 2021-11-04 DIAGNOSIS — Z23 Encounter for immunization: Secondary | ICD-10-CM | POA: Diagnosis not present

## 2021-11-04 DIAGNOSIS — I10 Essential (primary) hypertension: Secondary | ICD-10-CM | POA: Diagnosis not present

## 2021-11-11 DIAGNOSIS — G4733 Obstructive sleep apnea (adult) (pediatric): Secondary | ICD-10-CM | POA: Diagnosis not present

## 2021-11-17 DIAGNOSIS — I10 Essential (primary) hypertension: Secondary | ICD-10-CM | POA: Diagnosis not present

## 2021-11-17 DIAGNOSIS — E1169 Type 2 diabetes mellitus with other specified complication: Secondary | ICD-10-CM | POA: Diagnosis not present

## 2021-12-11 DIAGNOSIS — G4733 Obstructive sleep apnea (adult) (pediatric): Secondary | ICD-10-CM | POA: Diagnosis not present

## 2022-01-06 DIAGNOSIS — I10 Essential (primary) hypertension: Secondary | ICD-10-CM | POA: Diagnosis not present

## 2022-01-06 DIAGNOSIS — G4733 Obstructive sleep apnea (adult) (pediatric): Secondary | ICD-10-CM | POA: Diagnosis not present

## 2022-01-10 DIAGNOSIS — G4733 Obstructive sleep apnea (adult) (pediatric): Secondary | ICD-10-CM | POA: Diagnosis not present

## 2022-01-11 DIAGNOSIS — G4733 Obstructive sleep apnea (adult) (pediatric): Secondary | ICD-10-CM | POA: Diagnosis not present

## 2022-02-04 DIAGNOSIS — E1142 Type 2 diabetes mellitus with diabetic polyneuropathy: Secondary | ICD-10-CM | POA: Diagnosis not present

## 2022-02-04 DIAGNOSIS — E1165 Type 2 diabetes mellitus with hyperglycemia: Secondary | ICD-10-CM | POA: Diagnosis not present

## 2022-02-05 ENCOUNTER — Other Ambulatory Visit: Payer: Self-pay | Admitting: Family Medicine

## 2022-02-05 DIAGNOSIS — M5416 Radiculopathy, lumbar region: Secondary | ICD-10-CM

## 2022-02-11 ENCOUNTER — Ambulatory Visit
Admission: RE | Admit: 2022-02-11 | Discharge: 2022-02-11 | Disposition: A | Discharge status: Home / Self Care | Payer: Medicare Other | Source: Ambulatory Visit | Attending: Family Medicine | Admitting: Family Medicine

## 2022-02-11 DIAGNOSIS — M5416 Radiculopathy, lumbar region: Secondary | ICD-10-CM

## 2022-02-11 DIAGNOSIS — G4733 Obstructive sleep apnea (adult) (pediatric): Secondary | ICD-10-CM | POA: Diagnosis not present

## 2022-02-24 ENCOUNTER — Other Ambulatory Visit: Payer: Self-pay | Admitting: Gastroenterology

## 2022-03-12 DIAGNOSIS — G4733 Obstructive sleep apnea (adult) (pediatric): Secondary | ICD-10-CM | POA: Diagnosis not present

## 2022-03-26 NOTE — H&P (Signed)
History of Present Illness General:         47 year old female, new patient, is referred by Dr. Nancy Fetter to schedule first screening colonoscopy due to age. She has never had a colonoscopy. She denies family history of colon cancer. She has chronic diarrhea postcholecystectomy 15 years ago. Denies any other GI symptom such as abdominal pain or rectal bleeding.  Medical history significant for diabetes, morbid obesity (BMI 55), hypertension, PCOS, sleep apnea on CPAP, chronic diarrhea postcholecystectomy. Recent A1c 7.9. Patient states she has high tolerance to medication and has been difficult to sedate in the past. She was awake during her wisdom tooth surgery when she was sedated. Current Medications Taking Atenolol-Chlorthalidone 50-25 MG Tablet 1 tablet Orally Once a day Enalapril Maleate 10 MG Tablet 1 tablet Orally Once a day Farxiga(Dapagliflozin Propanediol) 10 MG Tablet 1 tablet Orally Once a day Lyrica(Pregabalin) 50 MG Capsule 1 capsule Orally once a day metFORMIN HCl ER 500 MG Tablet Extended Release 24 Hour TAKE 3 TABLETS BY MOUTH ONCE DAILY Pravastatin Sodium 20 MG Tablet 1 tablet Orally Once a day Accu-Chek Guide(Glucose Blood) - Strip as directed to monitor glucose twice daily In Vitro Accu-Chek Softclix Lancets(Lancets) - Miscellaneous as directed to monitor glucose twice daily Allopurinol 300 MG Tablet TAKE 1 TABLET BY MOUTH ONCE DAILY Vitamin D3(Cholecalciferol) 50 MCG (2000 UT) Tablet 1 tablet Orally Once a day CPAP uses nightly Medication List reviewed and reconciled with the patient  Past Medical History      Diabetes mellitus.      Hypertension.      Gout.      Polycystic ovarian syndrome.      obstructive sleep apnea on CPAP.      vitamin D deficiency.      Cervical cancer (2007).      Depression.      Chronic diarrhea s/p cholecystectomy.      Morbid obesity.      COVID (Sept 2023).      Peripheral neuropathy.      Sleep apnea uses cpap.  Surgical History        cholecystectomy 2007       bilateral hernia repair       colposcopy       "tumor removed from neck"       LEEP  Family History Father: alive 47 yrs, depression, alcohol abuse, stroke Mother: alive 28 yrs, hypertension, asthma, depression Paternal [place] Father: unknown Paternal [place] Mother: unknown, hypertension, stroke Maternal Grand Father: deceased 72 yrs, hypertension, diabetes Maternal Grand Mother: alive 74 yrs, hypertension, thyroid cancer, bone cancer Brother 1: alive 38 yrs, Hypertension Sister 1: alive, Breast cancer maternal aunt died of bone cancer No Family History of Colon Cancer, Polyps, or Liver Disease.  Social History General:  Tobacco use      cigarettes:  Former smoker     Quit in year  2008     Tobacco history last updated  02/24/2022     Vaping  No Alcohol: yes, rare with less than 5 drinks a month: wine, liquor. Caffeine: coffee, 1-2 servings weekly. Recreational drug use: no, in past: marijuana. Exercise: walks, daily. Marital Status: Married. Children: 2: Donata Clay. Religion: believes in god/higher being but does not consider herself religious. Seat belt use: yes.  Allergies Coconut (Diagnostic): hives, vomiting Hospitalization/Major Diagnostic Procedure "mental illness" None in the past year 02/2022  Review of Systems GI PROCEDURE:         Pacemaker/ AICD no.  Artificial heart valves no.  MI/heart attack no.  Abnormal heart rhythm no.  Angina YES.  CVA no.  Hypertension YES.  Hypotension no.  Asthma, COPD no.  Sleep apnea YES, using CPAP.  Seizure disorders no.  Artificial joints no.  Severe DJD no.  Diabetes YES, type II.  Significant headaches YES.  Vertigo YES.  Depression/anxiety YES.  Abnormal bleeding no.  Kidney Disease no.  Liver disease no.  Chance of pregnancy no.  Blood transfusion no.   Vital Signs Wt: 334.4, Wt change: 5 lbs, Ht: 65, BMI: 55.64, Temp: 97.3, Pulse sitting: 80, BP sitting:  118/76. Examination Gastroenterology Exam:        GENERAL APPEARANCE: Well developed, obese, no active distress, pleasant, no acute distress; she is able to get on and off exam table with my help. No assistive walking devices..         ORAL CAVITY: Lips, teeth and gums are normal. Pharynx, tongue, mucosa normal.         SCLERA: anicteric.         RESPIRATORY Breath sounds clear to auscultation. No wheezes, rales or rhonchi. Respiration even and unlabored.         CARDIOVASCULAR Normal RRR w/o murmers or gallops. No peripheral edema.         ABDOMEN No masses palpated. Liver and spleen not palpated, normal. Bowel sounds normal, Abdomen obese, soft, not tender..   Assessments 1. Colon cancer screening - Z12.11 (Primary) 2. Morbid obesity - E66.01 3. OSA (obstructive sleep apnea) - G47.33  Treatment 1. Colon cancer screening        IMAGING: Colonoscopy Notes: Risks and Benefits of Colon Cancer Screening Options were discussed including traditional colonoscopy, and Cologuard test. Patient would like to proceed with traditional Colonoscopy.  I discussed risks and benefits of colonoscopy procedure with patient to include risk of bleeding or colon perforation. Patient expressed understanding and agrees to proceed with procedure.  Colon Cancer Screening Options were discussed including traditional colonoscopy, and Cologuard test. Patient prefers to do traditional colonoscopy. She declined Cologuard.    2. Morbid obesity  Notes: Scheduling colonoscopy in hospital due to BMI greater than 50.

## 2022-03-31 ENCOUNTER — Encounter (HOSPITAL_COMMUNITY): Payer: Self-pay | Admitting: Gastroenterology

## 2022-03-31 NOTE — Progress Notes (Signed)
Natasha Lopez  Prep instructions- reviewed  PCP-  Donald Prose, MD  EKG-03/13/21 Echo- n/a Cath- n/a Stress- n/a ICD/PM- n/a Blood thinner- n/a GLP-1-n/a  Anesthesia Review:  History of HTN, PCOS, difficulty to sedate. Patient states during wisdom teeth surgery she was awake the entire time,woke up during, and they had a hard time sedating her.

## 2022-04-07 ENCOUNTER — Encounter (HOSPITAL_COMMUNITY): Payer: Self-pay | Admitting: Gastroenterology

## 2022-04-07 ENCOUNTER — Other Ambulatory Visit: Payer: Self-pay

## 2022-04-07 ENCOUNTER — Ambulatory Visit (HOSPITAL_BASED_OUTPATIENT_CLINIC_OR_DEPARTMENT_OTHER): Payer: Medicare Other | Admitting: Certified Registered Nurse Anesthetist

## 2022-04-07 ENCOUNTER — Ambulatory Visit (HOSPITAL_COMMUNITY)
Admission: RE | Admit: 2022-04-07 | Discharge: 2022-04-07 | Disposition: A | Discharge status: Home / Self Care | Payer: Medicare Other | Attending: Gastroenterology | Admitting: Gastroenterology

## 2022-04-07 ENCOUNTER — Ambulatory Visit (HOSPITAL_COMMUNITY): Payer: Medicare Other | Admitting: Certified Registered Nurse Anesthetist

## 2022-04-07 ENCOUNTER — Encounter (HOSPITAL_COMMUNITY)
Admission: RE | Disposition: A | Discharge status: Home / Self Care | Payer: Self-pay | Source: Home / Self Care | Attending: Gastroenterology

## 2022-04-07 DIAGNOSIS — K644 Residual hemorrhoidal skin tags: Secondary | ICD-10-CM | POA: Diagnosis not present

## 2022-04-07 DIAGNOSIS — Z87891 Personal history of nicotine dependence: Secondary | ICD-10-CM | POA: Diagnosis not present

## 2022-04-07 DIAGNOSIS — Z9049 Acquired absence of other specified parts of digestive tract: Secondary | ICD-10-CM | POA: Diagnosis not present

## 2022-04-07 DIAGNOSIS — Z7984 Long term (current) use of oral hypoglycemic drugs: Secondary | ICD-10-CM | POA: Insufficient documentation

## 2022-04-07 DIAGNOSIS — Z6841 Body Mass Index (BMI) 40.0 and over, adult: Secondary | ICD-10-CM | POA: Insufficient documentation

## 2022-04-07 DIAGNOSIS — Z79899 Other long term (current) drug therapy: Secondary | ICD-10-CM | POA: Diagnosis not present

## 2022-04-07 DIAGNOSIS — K648 Other hemorrhoids: Secondary | ICD-10-CM | POA: Diagnosis not present

## 2022-04-07 DIAGNOSIS — Z9989 Dependence on other enabling machines and devices: Secondary | ICD-10-CM | POA: Diagnosis not present

## 2022-04-07 DIAGNOSIS — K529 Noninfective gastroenteritis and colitis, unspecified: Secondary | ICD-10-CM | POA: Insufficient documentation

## 2022-04-07 DIAGNOSIS — I10 Essential (primary) hypertension: Secondary | ICD-10-CM | POA: Insufficient documentation

## 2022-04-07 DIAGNOSIS — E119 Type 2 diabetes mellitus without complications: Secondary | ICD-10-CM | POA: Diagnosis not present

## 2022-04-07 DIAGNOSIS — K635 Polyp of colon: Secondary | ICD-10-CM | POA: Diagnosis not present

## 2022-04-07 DIAGNOSIS — Z1211 Encounter for screening for malignant neoplasm of colon: Secondary | ICD-10-CM | POA: Insufficient documentation

## 2022-04-07 DIAGNOSIS — D123 Benign neoplasm of transverse colon: Secondary | ICD-10-CM

## 2022-04-07 DIAGNOSIS — G4733 Obstructive sleep apnea (adult) (pediatric): Secondary | ICD-10-CM | POA: Insufficient documentation

## 2022-04-07 HISTORY — PX: COLONOSCOPY WITH PROPOFOL: SHX5780

## 2022-04-07 HISTORY — PX: POLYPECTOMY: SHX5525

## 2022-04-07 HISTORY — PX: BIOPSY: SHX5522

## 2022-04-07 LAB — GLUCOSE, CAPILLARY: Glucose-Capillary: 154 mg/dL — ABNORMAL HIGH (ref 70–99)

## 2022-04-07 LAB — PREGNANCY, URINE: Preg Test, Ur: NEGATIVE

## 2022-04-07 SURGERY — COLONOSCOPY WITH PROPOFOL
Anesthesia: Monitor Anesthesia Care

## 2022-04-07 MED ORDER — GLYCOPYRROLATE PF 0.2 MG/ML IJ SOSY
PREFILLED_SYRINGE | INTRAMUSCULAR | Status: DC | PRN
  Administered 2022-04-07: .1 mg via INTRAVENOUS

## 2022-04-07 MED ORDER — SODIUM CHLORIDE 0.9 % IV SOLN: INTRAVENOUS | Status: DC

## 2022-04-07 MED ORDER — LACTATED RINGERS IV SOLN: INTRAVENOUS | Status: DC

## 2022-04-07 MED ORDER — PROPOFOL 1000 MG/100ML IV EMUL
INTRAVENOUS | Status: AC
  Filled 2022-04-07: qty 100

## 2022-04-07 MED ORDER — PROPOFOL 500 MG/50ML IV EMUL
INTRAVENOUS | Status: DC | PRN
  Administered 2022-04-07: 100 ug/kg/min via INTRAVENOUS

## 2022-04-07 MED ORDER — MIDAZOLAM HCL 2 MG/2ML IJ SOLN
INTRAMUSCULAR | Status: DC | PRN
  Administered 2022-04-07: 1 mg via INTRAVENOUS

## 2022-04-07 MED ORDER — DEXMEDETOMIDINE HCL IN NACL 80 MCG/20ML IV SOLN
INTRAVENOUS | Status: DC | PRN
  Administered 2022-04-07: 4 ug via BUCCAL

## 2022-04-07 MED ORDER — PROPOFOL 10 MG/ML IV BOLUS
INTRAVENOUS | Status: DC | PRN
  Administered 2022-04-07: 50 mg via INTRAVENOUS
  Administered 2022-04-07: 30 mg via INTRAVENOUS

## 2022-04-07 MED ORDER — MIDAZOLAM HCL 2 MG/2ML IJ SOLN
INTRAMUSCULAR | Status: AC
  Filled 2022-04-07: qty 2

## 2022-04-07 MED ORDER — LACTATED RINGERS IV SOLN
INTRAVENOUS | Status: DC | PRN
  Administered 2022-04-07: 1000 mL via INTRAVENOUS

## 2022-04-07 SURGICAL SUPPLY — 22 items

## 2022-04-07 NOTE — Anesthesia Preprocedure Evaluation (Addendum)
Anesthesia Evaluation  Patient identified by MRN, date of birth, ID band Patient awake    Reviewed: Allergy & Precautions, NPO status , Patient's Chart, lab work & pertinent test results  History of Anesthesia Complications (+) AWARENESS UNDER ANESTHESIA and history of anesthetic complications (pt relates awareness during dental extraction and cholecystectomy)  Airway Mallampati: II  TM Distance: >3 FB Neck ROM: Full    Dental  (+) Dental Advisory Given, Poor Dentition, Missing, Chipped   Pulmonary sleep apnea and Continuous Positive Airway Pressure Ventilation , former smoker   breath sounds clear to auscultation       Cardiovascular hypertension, Pt. on medications and Pt. on home beta blockers  Rhythm:Regular Rate:Normal     Neuro/Psych  Headaches   Depression       GI/Hepatic negative GI ROS, Neg liver ROS,,,  Endo/Other  diabetes (glu 154), Oral Hypoglycemic Agents  Polycystic ovarian syndrome BMI 55.7  Renal/GU negative Renal ROS     Musculoskeletal   Abdominal  (+) + obese  Peds  Hematology   Anesthesia Other Findings   Reproductive/Obstetrics                             Anesthesia Physical Anesthesia Plan  ASA: 3  Anesthesia Plan: MAC   Post-op Pain Management: Minimal or no pain anticipated   Induction:   PONV Risk Score and Plan: 2 and Ondansetron and Treatment may vary due to age or medical condition  Airway Management Planned: Natural Airway and Simple Face Mask  Additional Equipment: None  Intra-op Plan:   Post-operative Plan:   Informed Consent: I have reviewed the patients History and Physical, chart, labs and discussed the procedure including the risks, benefits and alternatives for the proposed anesthesia with the patient or authorized representative who has indicated his/her understanding and acceptance.     Dental advisory given  Plan Discussed with:  CRNA and Surgeon  Anesthesia Plan Comments: (Discussed pt's prior recall during surgery with her.  She understands this colonoscopy is being done under sedation, and that it is unlikely but possible that there may be portions that she recalls)       Anesthesia Quick Evaluation

## 2022-04-07 NOTE — Transfer of Care (Signed)
Immediate Anesthesia Transfer of Care Note  Patient: Natasha Lopez  Procedure(s) Performed: Procedure(s): COLONOSCOPY WITH PROPOFOL (N/A) BIOPSY POLYPECTOMY  Patient Location: PACU  Anesthesia Type:MAC  Level of Consciousness: Patient easily awoken, sedated, comfortable, cooperative, following commands, responds to stimulation.   Airway & Oxygen Therapy: Patient spontaneously breathing, ventilating well, oxygen via simple oxygen mask.  Post-op Assessment: Report given to PACU RN, vital signs reviewed and stable, moving all extremities.   Post vital signs: Reviewed and stable.  Complications: No apparent anesthesia complications Last Vitals:  Vitals Value Taken Time  BP 137/70 04/07/22 1042  Temp    Pulse 109 04/07/22 1043  Resp 23 04/07/22 1043  SpO2 100 % 04/07/22 1043  Vitals shown include unvalidated device data.  Last Pain:  Vitals:   04/07/22 0956  TempSrc: Tympanic  PainSc: 4          Complications: No notable events documented.

## 2022-04-07 NOTE — Discharge Instructions (Signed)

## 2022-04-07 NOTE — Anesthesia Postprocedure Evaluation (Signed)
Anesthesia Post Note  Patient: Natasha Lopez  Procedure(s) Performed: COLONOSCOPY WITH PROPOFOL BIOPSY POLYPECTOMY     Patient location during evaluation: Endoscopy Anesthesia Type: MAC Level of consciousness: awake and alert, patient cooperative and oriented Pain management: pain level controlled Vital Signs Assessment: post-procedure vital signs reviewed and stable Respiratory status: spontaneous breathing, nonlabored ventilation and respiratory function stable Cardiovascular status: blood pressure returned to baseline and stable Postop Assessment: no apparent nausea or vomiting and adequate PO intake Anesthetic complications: no   No notable events documented.  Last Vitals:  Vitals:   04/07/22 1050 04/07/22 1100  BP: (!) 155/98 (!) 149/73  Pulse: (!) 109 97  Resp: 20 (!) 26  Temp:    SpO2: 96% 100%    Last Pain:  Vitals:   04/07/22 1100  TempSrc:   PainSc: 0-No pain                 Jaryn Rosko,E. Keiyon Plack

## 2022-04-07 NOTE — Anesthesia Procedure Notes (Signed)
Procedure Name: MAC Date/Time: 04/07/2022 10:17 AM  Performed by: Deliah Boston, CRNAPre-anesthesia Checklist: Patient identified, Emergency Drugs available, Suction available and Patient being monitored Patient Re-evaluated:Patient Re-evaluated prior to induction Oxygen Delivery Method: Simple face mask Preoxygenation: Pre-oxygenation with 100% oxygen Placement Confirmation: positive ETCO2 and breath sounds checked- equal and bilateral Dental Injury: Teeth and Oropharynx as per pre-operative assessment

## 2022-04-07 NOTE — Interval H&P Note (Signed)
History and Physical Interval Note: 46/female for initial colon cancer screening, chronic diarrhea, morbid obesity,for colonoscopy with propofol.  04/07/2022 9:54 AM  Natasha Lopez  has presented today for colonoscopy, with the diagnosis of colon cancer screening z12.11.  The various methods of treatment have been discussed with the patient and family. After consideration of risks, benefits and other options for treatment, the patient has consented to  Procedure(s): COLONOSCOPY WITH PROPOFOL (N/A) as a surgical intervention.  The patient's history has been reviewed, patient examined, no change in status, stable for surgery.  I have reviewed the patient's chart and labs.  Questions were answered to the patient's satisfaction.     Ronnette Juniper

## 2022-04-07 NOTE — Op Note (Signed)
[facility] Patient Name: Natasha Lopez Procedure Date: 04/07/2022 MRN: [id number] Attending MD: Ronnette Juniper , MD, [id number] Date of Birth: Mar 02, 1975 CSN: [id number] Age: 47 Admit Type: Outpatient Procedure:                Colonoscopy Indications:              Screening for colorectal malignant neoplasm, This                            is the patient's first colonoscopy, Incidental -                            Chronic diarrhea Providers:                Ronnette Juniper, MD, Vladimir Crofts, RN, Luan Moore,                            Technician, Heide Scales, CRNA Referring MD:             Sandria Senter Medicines:                Monitored Anesthesia Care Complications:            No immediate complications. Estimated blood loss:                            Minimal. Estimated Blood Loss:     Estimated blood loss was minimal. Procedure:                Pre-Anesthesia Assessment:                           - Prior to the procedure, a History and Physical                            was performed, and patient medications and                            allergies were reviewed. The patient's tolerance of                            previous anesthesia was also reviewed. The risks                            and benefits of the procedure and the sedation                            options and risks were discussed with the patient.                            All questions were answered, and informed consent                            was obtained. Prior Anticoagulants: The patient has                            taken no  anticoagulant or antiplatelet agents. ASA                            Grade Assessment: III - A patient with severe                            systemic disease. After reviewing the risks and                            benefits, the patient was deemed in satisfactory                            condition to undergo the procedure.                           After obtaining  informed consent, the colonoscope                            was passed under direct vision. Throughout the                            procedure, the patient's blood pressure, pulse, and                            oxygen saturations were monitored continuously. The                            [id number]) Olympus colonoscope was                            introduced through the anus and advanced to the the                            terminal ileum. The colonoscopy was performed                            without difficulty. The patient tolerated the                            procedure well. The quality of the bowel                            preparation was adequate to identify polyps greater                            than 5 mm in size. The terminal ileum, ileocecal                            valve, appendiceal orifice, and rectum were                            photographed. Scope In: 10:22:39 AM Scope Out: 10:36:01 AM Scope Withdrawal Time: 0 hours 11 minutes 9 seconds  Total Procedure Duration: 0 hours 13 minutes 22 seconds  Findings:      Skin tags were found on perianal exam.      Hemorrhoids were found on perianal exam.      The terminal ileum appeared normal.      A 4 mm polyp was found in the transverse colon. The polyp was sessile.       The polyp was removed with a cold biopsy forceps. Resection and       retrieval were complete.      Biopsies for histology were taken with a cold forceps for evaluation of       microscopic colitis.      Non-bleeding internal hemorrhoids were found during retroflexion.      The exam was otherwise without abnormality. Impression:               - Perianal skin tags found on perianal exam.                           - Hemorrhoids found on perianal exam.                           - The examined portion of the ileum was normal.                           - One 4 mm polyp in the transverse colon, removed                            with a  cold biopsy forceps. Resected and retrieved.                           - Non-bleeding internal hemorrhoids.                           - The examination was otherwise normal.                           - Biopsies were taken with a cold forceps for                            evaluation of microscopic colitis. Moderate Sedation:      Patient did not receive moderate sedation for this procedure, but       instead received monitored anesthesia care. Recommendation:           - Patient has a contact number available for                            emergencies. The signs and symptoms of potential                            delayed complications were discussed with the                            patient. Return to normal activities tomorrow.                            Written discharge instructions were provided to the  patient.                           - Resume regular diet.                           - Continue present medications.                           - Await pathology results.                           - Repeat colonoscopy for surveillance based on                            pathology results. Procedure Code(s):        --- Professional ---                           [phone], Colonoscopy, flexible; with biopsy, single                            or multiple Diagnosis Code(s):        --- Professional ---                           Z12.11, Encounter for screening for malignant                            neoplasm of colon                           K64.8, Other hemorrhoids                           D12.3, Benign neoplasm of transverse colon (hepatic                            flexure or splenic flexure)                           K64.4, Residual hemorrhoidal skin tags CPT copyright 2022 [employer]. All rights reserved. The codes documented in this report are preliminary and upon coder review may  be revised to meet current compliance requirements. Ronnette Juniper, MD 04/07/2022 10:41:20 AM This report has been signed electronically. Number of Addenda: 0

## 2022-04-09 ENCOUNTER — Encounter (HOSPITAL_COMMUNITY): Payer: Self-pay | Admitting: Gastroenterology

## 2022-04-09 LAB — SURGICAL PATHOLOGY

## 2022-04-12 DIAGNOSIS — G4733 Obstructive sleep apnea (adult) (pediatric): Secondary | ICD-10-CM | POA: Diagnosis not present

## 2022-04-14 ENCOUNTER — Other Ambulatory Visit: Payer: Self-pay | Admitting: Family Medicine

## 2022-04-14 DIAGNOSIS — I10 Essential (primary) hypertension: Secondary | ICD-10-CM | POA: Diagnosis not present

## 2022-04-14 DIAGNOSIS — M5416 Radiculopathy, lumbar region: Secondary | ICD-10-CM

## 2022-04-14 DIAGNOSIS — G4733 Obstructive sleep apnea (adult) (pediatric): Secondary | ICD-10-CM | POA: Diagnosis not present

## 2022-04-16 DIAGNOSIS — G4733 Obstructive sleep apnea (adult) (pediatric): Secondary | ICD-10-CM | POA: Diagnosis not present

## 2022-04-22 ENCOUNTER — Other Ambulatory Visit (HOSPITAL_COMMUNITY): Payer: Self-pay | Admitting: Family Medicine

## 2022-04-22 DIAGNOSIS — M5416 Radiculopathy, lumbar region: Secondary | ICD-10-CM

## 2022-04-29 ENCOUNTER — Ambulatory Visit (HOSPITAL_COMMUNITY)
Admission: RE | Admit: 2022-04-29 | Discharge: 2022-04-29 | Disposition: A | Discharge status: Home / Self Care | Payer: Medicare Other | Source: Ambulatory Visit | Attending: Family Medicine | Admitting: Family Medicine

## 2022-04-29 ENCOUNTER — Ambulatory Visit (HOSPITAL_COMMUNITY): Payer: Medicare Other

## 2022-04-29 ENCOUNTER — Encounter (HOSPITAL_COMMUNITY): Payer: Self-pay

## 2022-04-29 DIAGNOSIS — M545 Low back pain, unspecified: Secondary | ICD-10-CM | POA: Diagnosis not present

## 2022-04-29 DIAGNOSIS — M5416 Radiculopathy, lumbar region: Secondary | ICD-10-CM | POA: Diagnosis not present

## 2022-05-05 DIAGNOSIS — I10 Essential (primary) hypertension: Secondary | ICD-10-CM | POA: Diagnosis not present

## 2022-05-05 DIAGNOSIS — E119 Type 2 diabetes mellitus without complications: Secondary | ICD-10-CM | POA: Diagnosis not present

## 2022-05-05 DIAGNOSIS — E1169 Type 2 diabetes mellitus with other specified complication: Secondary | ICD-10-CM | POA: Diagnosis not present

## 2022-05-22 DIAGNOSIS — M545 Low back pain, unspecified: Secondary | ICD-10-CM | POA: Diagnosis not present

## 2022-07-31 DIAGNOSIS — G4733 Obstructive sleep apnea (adult) (pediatric): Secondary | ICD-10-CM | POA: Diagnosis not present

## 2022-08-01 DIAGNOSIS — E119 Type 2 diabetes mellitus without complications: Secondary | ICD-10-CM | POA: Diagnosis not present

## 2022-08-01 DIAGNOSIS — H40033 Anatomical narrow angle, bilateral: Secondary | ICD-10-CM | POA: Diagnosis not present

## 2022-08-16 DIAGNOSIS — H5213 Myopia, bilateral: Secondary | ICD-10-CM | POA: Diagnosis not present

## 2022-10-27 DIAGNOSIS — G4733 Obstructive sleep apnea (adult) (pediatric): Secondary | ICD-10-CM | POA: Diagnosis not present

## 2022-10-27 DIAGNOSIS — I10 Essential (primary) hypertension: Secondary | ICD-10-CM | POA: Diagnosis not present

## 2022-10-29 ENCOUNTER — Encounter (INDEPENDENT_AMBULATORY_CARE_PROVIDER_SITE_OTHER): Payer: Self-pay | Admitting: Adult Health

## 2022-10-29 ENCOUNTER — Ambulatory Visit (INDEPENDENT_AMBULATORY_CARE_PROVIDER_SITE_OTHER): Payer: Medicare Other | Admitting: Adult Health

## 2022-10-29 VITALS — BP 122/74 | HR 80 | Temp 97.5°F | Ht 65.0 in | Wt 317.0 lb

## 2022-10-29 DIAGNOSIS — E785 Hyperlipidemia, unspecified: Secondary | ICD-10-CM | POA: Diagnosis not present

## 2022-10-29 DIAGNOSIS — Z6841 Body Mass Index (BMI) 40.0 and over, adult: Secondary | ICD-10-CM | POA: Diagnosis not present

## 2022-10-29 DIAGNOSIS — E559 Vitamin D deficiency, unspecified: Secondary | ICD-10-CM | POA: Diagnosis not present

## 2022-10-29 DIAGNOSIS — Z0289 Encounter for other administrative examinations: Secondary | ICD-10-CM

## 2022-10-29 NOTE — Progress Notes (Signed)
Office: [phone]  /  Fax: [phone]   Initial Visit  GRISEL TRAVASSOS was seen in clinic today to evaluate for obesity. She is interested in losing weight to improve overall health and reduce the risk of weight related complications. She presents today to review program treatment options, initial physical assessment, and evaluation.     She was referred by: Friend or Family  When asked what else they would like to accomplish? She states: Improve energy levels and physical activity, Improve existing medical conditions, Reduce number of medications, Improve quality of life, Improve appearance, and Improve self-confidence  Weight history: She reports being overweight/obese since age 47  When asked how has your weight affected you? She states: Contributed to medical problems, Contributed to orthopedic problems or mobility issues, Having fatigue, Having poor endurance, Problems with eating patterns, and Has affected mood   Some associated conditions: Hypertension, Hyperlipidemia, Diabetes, and Vitamin D Deficiency  Contributing factors: Family history, Nutritional, Medications, Stress, Reduced physical activity, and Eating patterns  Weight promoting medications identified: Beta-blockers  Current nutrition plan: None  Current level of physical activity: None  Current or previous pharmacotherapy: None  Response to medication: Never tried medications   Past medical history includes:   Past Medical History:  Diagnosis Date   Diabetes mellitus    Hypertension    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  Peak Weight:406 , Body Fat%:56.1, Visceral Fat Rating:21, Weight trend over the last 12 months: Unchanged  General:  Alert, oriented and cooperative. Patient is in no acute distress.  Respiratory: Normal respiratory effort, no problems with respiration noted   Gait: able to ambulate independently  Mental Status: Normal mood and affect. Normal behavior. Normal judgment and thought content.   DIAGNOSTIC DATA REVIEWED:  BMET    Component Value Date/Time   NA 136 02/17/2020 0239   K 3.9 02/17/2020 0239   CL 99 02/17/2020 0239   CO2 25 02/17/2020 0239   GLUCOSE 237 (H) 02/17/2020 0239   BUN 11 02/17/2020 0239   CREATININE 0.96 02/17/2020 0239   CREATININE 0.83 11/03/2012 0858   CALCIUM 9.3 02/17/2020 0239   GFRNONAA >60 02/17/2020 0239   GFRAA >60 01/10/2019 0956   Lab Results  Component Value Date   HGBA1C 6.2 (A) 11/08/2012   HGBA1C 5.8 09/19/2012   No results found for: "INSULIN" CBC    Component Value Date/Time   WBC 10.7 (H) 02/17/2020 0239   RBC 4.49 02/17/2020 0239   HGB 12.7 02/17/2020 0239   HCT 40.9 02/17/2020 0239   PLT 397 02/17/2020 0239   MCV 91.1 02/17/2020 0239   MCH 28.3 02/17/2020 0239   MCHC 31.1 02/17/2020 0239   RDW 13.7 02/17/2020 0239   Iron/TIBC/Ferritin/ %Sat No results found for: "IRON", "TIBC", "FERRITIN", "IRONPCTSAT" Lipid Panel     Component Value Date/Time   CHOL 168 09/19/2012 1624   TRIG 179 (H) 09/19/2012 1624   HDL 33 (L) 09/19/2012 1624   CHOLHDL 5.1 09/19/2012 1624   VLDL  36 09/19/2012 1624   LDLCALC 99 09/19/2012 1624   Hepatic Function Panel     Component Value Date/Time   PROT 8.1 05/29/2012 2009   ALBUMIN 3.5 05/29/2012 2009   AST 14 05/29/2012 2009   ALT 16 05/29/2012 2009   ALKPHOS 78 05/29/2012 2009   BILITOT 0.4 05/29/2012 2009      Component Value Date/Time   TSH 3.614 02/17/2020 0900   TSH 2.684 03/31/2007 2121     Assessment and Plan:   Vitamin D deficiency  Hyperlipidemia,  unspecified hyperlipidemia type  Morbid obesity (HCC), Starting BMI 52.75  Establish with [facility] Provider   Obesity Treatment / Action Plan:  Patient will work on garnering support from family and friends to begin weight loss journey. Will work on eliminating or reducing the presence of highly palatable, calorie dense foods in the home. Will complete provided nutritional and psychosocial assessment questionnaire before the next appointment. Will be scheduled for indirect calorimetry to determine resting energy expenditure in a fasting state.  This will allow [place] to create a reduced calorie, high-protein meal plan to promote loss of fat mass while preserving muscle mass. Counseled on the health benefits of losing 5%-15% of total body weight. Was counseled on nutritional approaches to weight loss and benefits of reducing processed foods and consuming plant-based foods and high quality protein as part of nutritional weight management. Was counseled on pharmacotherapy and role as an adjunct in weight management.   Obesity Education Performed Today:  She was weighed on the bioimpedance scale and results were discussed and documented in the synopsis.  We discussed obesity as a disease and the importance of a more detailed evaluation of all the factors contributing to the disease.  We discussed the importance of long term lifestyle changes which include nutrition, exercise and behavioral modifications as well as the importance of customizing this to her specific health and social needs.  We discussed the benefits of reaching a healthier weight to alleviate the symptoms of existing conditions and reduce the risks of the biomechanical, metabolic and psychological effects of obesity.  JULIANAH ERCOLANI appears to be in the action stage of change and states they are ready to start intensive lifestyle modifications and behavioral modifications.  30 minutes was spent today on this visit including the above  counseling, pre-visit chart review, and post-visit documentation.  Reviewed by clinician on day of visit: allergies, medications, problem list, medical history, surgical history, family history, social history, and previous encounter notes pertinent to obesity diagnosis.   Maymie Brunke d. Davanta Meuser, NP-C

## 2022-11-18 ENCOUNTER — Encounter (INDEPENDENT_AMBULATORY_CARE_PROVIDER_SITE_OTHER): Payer: Self-pay | Admitting: Family Medicine

## 2022-11-18 ENCOUNTER — Ambulatory Visit (INDEPENDENT_AMBULATORY_CARE_PROVIDER_SITE_OTHER): Payer: Medicare Other | Admitting: Family Medicine

## 2022-11-18 VITALS — BP 103/70 | HR 69 | Temp 97.5°F | Ht 65.0 in | Wt 315.0 lb

## 2022-11-18 DIAGNOSIS — E785 Hyperlipidemia, unspecified: Secondary | ICD-10-CM | POA: Diagnosis not present

## 2022-11-18 DIAGNOSIS — F323 Major depressive disorder, single episode, severe with psychotic features: Secondary | ICD-10-CM

## 2022-11-18 DIAGNOSIS — E282 Polycystic ovarian syndrome: Secondary | ICD-10-CM

## 2022-11-18 DIAGNOSIS — E1169 Type 2 diabetes mellitus with other specified complication: Secondary | ICD-10-CM | POA: Diagnosis not present

## 2022-11-18 DIAGNOSIS — E1165 Type 2 diabetes mellitus with hyperglycemia: Secondary | ICD-10-CM | POA: Diagnosis not present

## 2022-11-18 DIAGNOSIS — E559 Vitamin D deficiency, unspecified: Secondary | ICD-10-CM | POA: Diagnosis not present

## 2022-11-18 DIAGNOSIS — G4733 Obstructive sleep apnea (adult) (pediatric): Secondary | ICD-10-CM

## 2022-11-18 DIAGNOSIS — I152 Hypertension secondary to endocrine disorders: Secondary | ICD-10-CM | POA: Diagnosis not present

## 2022-11-18 DIAGNOSIS — E1159 Type 2 diabetes mellitus with other circulatory complications: Secondary | ICD-10-CM | POA: Diagnosis not present

## 2022-11-18 DIAGNOSIS — Z7984 Long term (current) use of oral hypoglycemic drugs: Secondary | ICD-10-CM

## 2022-11-18 DIAGNOSIS — K760 Fatty (change of) liver, not elsewhere classified: Secondary | ICD-10-CM | POA: Diagnosis not present

## 2022-11-18 DIAGNOSIS — R5383 Other fatigue: Secondary | ICD-10-CM | POA: Diagnosis not present

## 2022-11-18 DIAGNOSIS — R0602 Shortness of breath: Secondary | ICD-10-CM

## 2022-11-18 DIAGNOSIS — F32A Depression, unspecified: Secondary | ICD-10-CM

## 2022-11-18 DIAGNOSIS — E669 Obesity, unspecified: Secondary | ICD-10-CM

## 2022-11-18 DIAGNOSIS — Z6841 Body Mass Index (BMI) 40.0 and over, adult: Secondary | ICD-10-CM

## 2022-11-18 NOTE — Assessment & Plan Note (Signed)
She was diagnosed a while ago but isn't sure how she was diagnosed.  Last LFTs were WNL. CMP today.

## 2022-11-18 NOTE — Assessment & Plan Note (Addendum)
Patient has CPAP and wears with almost 100% compliance. Her sleep doctor is Dr. Betti Cruz.  She is still not sleeping well partially due daughters work schedule. 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  She is taking daytime naps.

## 2022-11-18 NOTE — Assessment & Plan Note (Signed)
Patient currently on vraylar.  She has been on many medications in the past.  She has a recent dose increase but isn't sure what the dose will be as she is picking it up today.  She denies suicidal or homicidal ideation.  Managed by [facility].
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
## 2022-11-18 NOTE — Assessment & Plan Note (Signed)
On OTC Vitamin D.  Last lab done 4 years ago. Vitamin D level ordered today.

## 2022-11-18 NOTE — Assessment & Plan Note (Signed)
On pravastain 20mg .  Last labs LDL controlled at 68, HDL low and triglycerides elevated at 228.  FLP today.

## 2022-11-18 NOTE — Assessment & Plan Note (Signed)
She does not have a GYN currently.  Has a history of very significant ovarian cysts - measuring more than 11cm. Information given for Dr. Charlotta Newton at [facility] in [place].

## 2022-11-18 NOTE — Assessment & Plan Note (Signed)
Patient most recent A1c of 7.9.  She is on metformin and farxiga.  Her family has a history of thyroid cancer but she is unaware of what kind.  She is worried about needing long term insulin.

## 2022-11-18 NOTE — Assessment & Plan Note (Signed)
Blood pressure well controlled today.  She was diagnosed over a decade ago.  She is on atenolol- chlorthalidone and enalapril.

## 2022-11-19 LAB — COMPREHENSIVE METABOLIC PANEL
ALT: 17 [IU]/L (ref 0–32)
AST: 16 [IU]/L (ref 0–40)
Albumin: 4.3 g/dL (ref 3.9–4.9)
Alkaline Phosphatase: 80 [IU]/L (ref 44–121)
BUN/Creatinine Ratio: 19 (ref 9–23)
BUN: 18 mg/dL (ref 6–24)
Bilirubin Total: 0.3 mg/dL (ref 0.0–1.2)
CO2: 23 mmol/L (ref 20–29)
Calcium: 10 mg/dL (ref 8.7–10.2)
Chloride: 100 mmol/L (ref 96–106)
Creatinine, Ser: 0.93 mg/dL (ref 0.57–1.00)
Globulin, Total: 3.1 g/dL (ref 1.5–4.5)
Glucose: 125 mg/dL — ABNORMAL HIGH (ref 70–99)
Potassium: 4.8 mmol/L (ref 3.5–5.2)
Sodium: 141 mmol/L (ref 134–144)
Total Protein: 7.4 g/dL (ref 6.0–8.5)
eGFR: 77 mL/min/{1.73_m2} (ref >59)

## 2022-11-19 LAB — CBC WITH DIFFERENTIAL/PLATELET
Basophils Absolute: 0.1 10*3/uL (ref 0.0–0.2)
Basos: 1 %
EOS (ABSOLUTE): 0.2 10*3/uL (ref 0.0–0.4)
Eos: 2 %
Hematocrit: 44.7 % (ref 34.0–46.6)
Hemoglobin: 14.2 g/dL (ref 11.1–15.9)
Immature Grans (Abs): 0 10*3/uL (ref 0.0–0.1)
Immature Granulocytes: 0 %
Lymphocytes Absolute: 2.6 10*3/uL (ref 0.7–3.1)
Lymphs: 27 %
MCH: 28.7 pg (ref 26.6–33.0)
MCHC: 31.8 g/dL (ref 31.5–35.7)
MCV: 90 fL (ref 79–97)
Monocytes Absolute: 0.5 10*3/uL (ref 0.1–0.9)
Monocytes: 5 %
Neutrophils Absolute: 6.2 10*3/uL (ref 1.4–7.0)
Neutrophils: 65 %
Platelets: 432 10*3/uL (ref 150–450)
RBC: 4.95 x10E6/uL (ref 3.77–5.28)
RDW: 13.4 % (ref 11.7–15.4)
WBC: 9.6 10*3/uL (ref 3.4–10.8)

## 2022-11-19 LAB — VITAMIN D 25 HYDROXY (VIT D DEFICIENCY, FRACTURES): Vit D, 25-Hydroxy: 29.7 ng/mL — ABNORMAL LOW (ref 30.0–100.0)

## 2022-11-19 LAB — HEMOGLOBIN A1C
Est. average glucose Bld gHb Est-mCnc: 163 mg/dL
Hgb A1c MFr Bld: 7.3 % — ABNORMAL HIGH (ref 4.8–5.6)

## 2022-11-19 LAB — INSULIN, RANDOM: INSULIN: 26 u[IU]/mL — ABNORMAL HIGH (ref 2.6–24.9)

## 2022-11-19 LAB — LIPID PANEL WITH LDL/HDL RATIO
Cholesterol, Total: 152 mg/dL (ref 100–199)
HDL: 36 mg/dL — ABNORMAL LOW (ref >39)
LDL Chol Calc (NIH): 90 mg/dL (ref 0–99)
LDL/HDL Ratio: 2.5 ratio (ref 0.0–3.2)
Triglycerides: 148 mg/dL (ref 0–149)
VLDL Cholesterol Cal: 26 mg/dL (ref 5–40)

## 2022-11-19 LAB — T4, FREE: Free T4: 1.4 ng/dL (ref 0.82–1.77)

## 2022-11-19 LAB — TSH: TSH: 1.97 u[IU]/mL (ref 0.450–4.500)

## 2022-11-19 LAB — T3: T3, Total: 153 ng/dL (ref 71–180)

## 2022-11-30 DIAGNOSIS — L680 Hirsutism: Secondary | ICD-10-CM | POA: Diagnosis not present

## 2022-11-30 DIAGNOSIS — Z23 Encounter for immunization: Secondary | ICD-10-CM | POA: Diagnosis not present

## 2022-11-30 DIAGNOSIS — Z0000 Encounter for general adult medical examination without abnormal findings: Secondary | ICD-10-CM | POA: Diagnosis not present

## 2022-11-30 DIAGNOSIS — I10 Essential (primary) hypertension: Secondary | ICD-10-CM | POA: Diagnosis not present

## 2022-11-30 DIAGNOSIS — M109 Gout, unspecified: Secondary | ICD-10-CM | POA: Diagnosis not present

## 2022-11-30 DIAGNOSIS — E1169 Type 2 diabetes mellitus with other specified complication: Secondary | ICD-10-CM | POA: Diagnosis not present

## 2022-11-30 DIAGNOSIS — E119 Type 2 diabetes mellitus without complications: Secondary | ICD-10-CM | POA: Diagnosis not present

## 2022-12-02 ENCOUNTER — Encounter (INDEPENDENT_AMBULATORY_CARE_PROVIDER_SITE_OTHER): Payer: Self-pay | Admitting: Family Medicine

## 2022-12-02 ENCOUNTER — Ambulatory Visit (INDEPENDENT_AMBULATORY_CARE_PROVIDER_SITE_OTHER): Payer: Medicare Other | Admitting: Family Medicine

## 2022-12-02 ENCOUNTER — Other Ambulatory Visit (INDEPENDENT_AMBULATORY_CARE_PROVIDER_SITE_OTHER): Payer: Self-pay | Admitting: Family Medicine

## 2022-12-02 VITALS — BP 109/70 | HR 75 | Temp 97.5°F | Ht 65.0 in | Wt 314.0 lb

## 2022-12-02 DIAGNOSIS — E1165 Type 2 diabetes mellitus with hyperglycemia: Secondary | ICD-10-CM

## 2022-12-02 DIAGNOSIS — E785 Hyperlipidemia, unspecified: Secondary | ICD-10-CM | POA: Diagnosis not present

## 2022-12-02 DIAGNOSIS — Z7985 Long-term (current) use of injectable non-insulin antidiabetic drugs: Secondary | ICD-10-CM

## 2022-12-02 DIAGNOSIS — Z6841 Body Mass Index (BMI) 40.0 and over, adult: Secondary | ICD-10-CM

## 2022-12-02 DIAGNOSIS — E559 Vitamin D deficiency, unspecified: Secondary | ICD-10-CM

## 2022-12-02 DIAGNOSIS — E1169 Type 2 diabetes mellitus with other specified complication: Secondary | ICD-10-CM

## 2022-12-02 MED ORDER — OZEMPIC (0.25 OR 0.5 MG/DOSE) 2 MG/1.5ML ~~LOC~~ SOPN: 0.2500 mg | PEN_INJECTOR | SUBCUTANEOUS | 0 refills | Status: DC

## 2022-12-02 MED ORDER — VITAMIN D (ERGOCALCIFEROL) 1.25 MG (50000 UNIT) PO CAPS: 50000.0000 [IU] | ORAL_CAPSULE | ORAL | 0 refills | Status: DC

## 2022-12-02 NOTE — Assessment & Plan Note (Signed)
 Discussed importance of vitamin d supplementation.  Vitamin d supplementation has been shown to decrease fatigue, decrease risk of progression to insulin resistance and then prediabetes, decreases risk of falling in older age and can even assist in decreasing depressive symptoms in PTSD.   Prescription for Vitamin D sent in.

## 2022-12-02 NOTE — Progress Notes (Signed)
SUBJECTIVE:  Chief Complaint: Obesity  Interim History: Presents for follow up.  She did try journaling but did not find the My Fitness Pal to be user friendly.  She couldn't find some of the  food options she was eating and she could not delete so me of the food she was logging.  She found most of the time she was frustrated due to the inability to alter the journal.  She tried to portion out protein she did cook at home.  She did not get some of the options on the structured plan due to not wanting to waste food.  She mentions she ate out more than she normally does in the last 2 weeks. No plans for Thanksgiving holiday.   Aviyah is here to discuss her progress with her obesity treatment plan. She is on the keeping a food journal and adhering to recommended goals of 1650-1800 calories and 125 grams of protein and states she is following her eating plan approximately 25 % of the time. She states she is walking some.   OBJECTIVE: Visit Diagnoses: Problem List Items Addressed This Visit       Endocrine   Type 2 diabetes mellitus with hyperglycemia (HCC) - Primary    Patient on metformin and farxiga.  Her family has a history of papillary thyroid carcinoma but not medullary thyroid cancer.  She was just encouraged to restart Glipizide by PCP and she has not picked it up yet.  She was nervous about her family history of thyroid cancer and giving herself an injection.       Relevant Medications   Semaglutide,0.25 or 0.5MG /DOS, (OZEMPIC, 0.25 OR 0.5 MG/DOSE,) 2 MG/1.5ML SOPN   Hyperlipidemia associated with type 2 diabetes mellitus (HCC)    The 10-year ASCVD risk score (Arnett DK, et al., 2019) is: 4.6%   Values used to calculate the score:     Age: 47 years     Sex: Female     Is Non-Hispanic African American: Yes     Diabetic: Yes     Tobacco smoker: No     Systolic Blood Pressure: 109 mmHg     Is BP treated: Yes     HDL Cholesterol: 36 mg/dL     Total Cholesterol: 152 mg/dL        Relevant Medications   Semaglutide,0.25 or 0.5MG /DOS, (OZEMPIC, 0.25 OR 0.5 MG/DOSE,) 2 MG/1.5ML SOPN     Other   Vitamin D deficiency    Discussed importance of vitamin d supplementation.  Vitamin d supplementation has been shown to decrease fatigue, decrease risk of progression to insulin resistance and then prediabetes, decreases risk of falling in older age and can even assist in decreasing depressive symptoms in PTSD.   Prescription for Vitamin D sent in.         Vitals Temp: (!) 97.5 F (36.4 C) BP: 109/70 Pulse Rate: 75 SpO2: 99 %   Anthropometric Measurements Height: 5\' 5"  (1.651 m) Weight: (!) 314 lb (142.4 kg) BMI (Calculated): 52.25 Weight at Last Visit: 315 lb Weight Lost Since Last Visit: 1 Weight Gained Since Last Visit: 0 Starting Weight: 315 lb Total Weight Loss (lbs): 1 lb (0.454 kg)   Body Composition  Body Fat %: 56.4 % Fat Mass (lbs): 177.6 lbs Muscle Mass (lbs): 130.2 lbs Visceral Fat Rating : 21   Other Clinical Data Today's Visit #: 2 Starting Date: 11/18/22     ASSESSMENT AND PLAN:  Diet: Lety is currently in the action stage  of change. As such, her goal is to continue with weight loss efforts. She has agreed to keeping a food journal and adhering to recommended goals of 1650-1800 calories and 120 or more grams of protein.  Exercise: Sydnee has been instructed that some exercise is better than none for weight loss and overall health benefits.   Behavior Modification:  We discussed the following Behavioral Modification Strategies today: work on keeping a strict food journal, planning for success, increasing lean protein intake.  No follow-ups on file.Marland Kitchen She was informed of the importance of frequent follow up visits to maximize her success with intensive lifestyle modifications for her multiple health conditions.  Attestation Statements:   Reviewed by clinician on day of visit: allergies, medications, problem list, medical history,  surgical history, family history, social history, and previous encounter notes.   Time spent on visit including pre-visit chart review and post-visit care and charting was 45 minutes.    Reuben Likes, MD

## 2022-12-02 NOTE — Assessment & Plan Note (Addendum)
Patient on metformin and farxiga.  Her family has a history of papillary thyroid carcinoma but not medullary thyroid cancer.  She was just encouraged to restart Glipizide by PCP and she has not picked it up yet.  She was nervous about her family history of thyroid cancer and giving herself an injection.

## 2022-12-02 NOTE — Assessment & Plan Note (Signed)
The 10-year ASCVD risk score (Arnett DK, et al., 2019) is: 4.6%   Values used to calculate the score:     Age: 47 years     Sex: Female     Is Non-Hispanic African American: Yes     Diabetic: Yes     Tobacco smoker: No     Systolic Blood Pressure: 109 mmHg     Is BP treated: Yes     HDL Cholesterol: 36 mg/dL     Total Cholesterol: 152 mg/dL

## 2022-12-17 ENCOUNTER — Ambulatory Visit (INDEPENDENT_AMBULATORY_CARE_PROVIDER_SITE_OTHER): Payer: Medicare Other | Admitting: Family Medicine

## 2022-12-17 ENCOUNTER — Encounter (INDEPENDENT_AMBULATORY_CARE_PROVIDER_SITE_OTHER): Payer: Self-pay | Admitting: Family Medicine

## 2022-12-17 VITALS — BP 102/66 | HR 76 | Temp 97.5°F | Ht 65.0 in | Wt 312.0 lb

## 2022-12-17 DIAGNOSIS — E669 Obesity, unspecified: Secondary | ICD-10-CM | POA: Diagnosis not present

## 2022-12-17 DIAGNOSIS — E559 Vitamin D deficiency, unspecified: Secondary | ICD-10-CM | POA: Diagnosis not present

## 2022-12-17 DIAGNOSIS — Z7985 Long-term (current) use of injectable non-insulin antidiabetic drugs: Secondary | ICD-10-CM

## 2022-12-17 DIAGNOSIS — Z6841 Body Mass Index (BMI) 40.0 and over, adult: Secondary | ICD-10-CM

## 2022-12-17 DIAGNOSIS — E1165 Type 2 diabetes mellitus with hyperglycemia: Secondary | ICD-10-CM | POA: Diagnosis not present

## 2022-12-17 DIAGNOSIS — Z7984 Long term (current) use of oral hypoglycemic drugs: Secondary | ICD-10-CM | POA: Diagnosis not present

## 2022-12-17 MED ORDER — VITAMIN D (ERGOCALCIFEROL) 1.25 MG (50000 UNIT) PO CAPS: 50000.0000 [IU] | ORAL_CAPSULE | ORAL | 0 refills | Status: DC

## 2022-12-17 NOTE — Assessment & Plan Note (Signed)
Lab done at initial appointment less than half of goal level.  She is on Rx Vitamin D. She denies nausea, vomiting, muscle weakness.  Needs refill today so that was sent in.

## 2022-12-17 NOTE — Progress Notes (Signed)
SUBJECTIVE:  Chief Complaint: Obesity  Interim History: Patient feels like she has made a bit of improvement but not as much improvement as she was hoping for.  Feels like there there is a hang up with the Ozempic.  Unfortunately she does not have the pen today.  The last week has been the same thing every night. Dinner has been soup and ham sandwich and salad if she picks that up.  She has changed over to the 45 calorie bread.  For the next few weeks she doesn't have much planned in terms of events or activities.  She has noticed a significant decrease in emotional eating.   Natasha Lopez is here to discuss her progress with her obesity treatment plan. She is on the keeping a food journal and adhering to recommended goals of 1650-1800 calories and 125 grams of protein and states she is following her eating plan approximately 50 % of the time. She states she is exercising 10-15 minutes 4 times per week.   OBJECTIVE: Visit Diagnoses: Problem List Items Addressed This Visit       Endocrine   Type 2 diabetes mellitus with hyperglycemia (HCC)   On semaglutide, farxiga and metformin.  Hasn't started Ozempic yet.  Plan is to start Ozempic today after this appointment. Will need to follow up on side effects, if any, at next appointment.        Other   Vitamin D deficiency - Primary   Lab done at initial appointment less than half of goal level.  She is on Rx Vitamin D. She denies nausea, vomiting, muscle weakness.  Needs refill today so that was sent in.      Relevant Medications   Vitamin D, Ergocalciferol, (DRISDOL) 1.25 MG (50000 UNIT) CAPS capsule   Other Visit Diagnoses       Obesity with starting BMI of 52.5         BMI 50.0-59.9, adult (HCC)           Vitals Temp: (!) 97.5 F (36.4 C) BP: 102/66 Pulse Rate: 76 SpO2: 99 %   Anthropometric Measurements Height: 5\' 5"  (1.651 m) Weight: (!) 312 lb (141.5 kg) BMI (Calculated): 51.92 Weight at Last Visit: 314 lb Weight Lost  Since Last Visit: 2 Weight Gained Since Last Visit: 0 Starting Weight: 315 lb Total Weight Loss (lbs): 3 lb (1.361 kg)   Body Composition  Body Fat %: 51 % Fat Mass (lbs): 159.6 lbs Muscle Mass (lbs): 145.6 lbs Total Body Water (lbs): 107.8 lbs Visceral Fat Rating : 19   Other Clinical Data Today's Visit #: 3 Starting Date: 11/18/22     ASSESSMENT AND PLAN:  Diet: Braelie is currently in the action stage of change. As such, her goal is to continue with weight loss efforts. She has agreed to keeping a food journal and adhering to recommended goals of 1650-1800 calories and 120 or more grams protein daily.  Her goal is to improve on the food logging consistency and aiming for her calorie and protein goals.  Exercise: Natasha Lopez has been instructed to work up to a goal of 150 minutes of combined cardio and strengthening exercise per week for weight loss and overall health benefits.   Behavior Modification:  We discussed the following Behavioral Modification Strategies today: increasing lean protein intake, increasing vegetables, meal planning and cooking strategies, better snacking choices, planning for success, and keep a strict food journal.   Return in about 3 weeks (around 01/07/2023).Marland Kitchen She was informed of the  importance of frequent follow up visits to maximize her success with intensive lifestyle modifications for her multiple health conditions.  Attestation Statements:   Reviewed by clinician on day of visit: allergies, medications, problem list, medical history, surgical history, family history, social history, and previous encounter notes.    Reuben Likes, MD

## 2022-12-17 NOTE — Assessment & Plan Note (Signed)
On semaglutide, farxiga and metformin.  Hasn't started Ozempic yet.  Plan is to start Ozempic today after this appointment. Will need to follow up on side effects, if any, at next appointment.

## 2022-12-23 ENCOUNTER — Telehealth (INDEPENDENT_AMBULATORY_CARE_PROVIDER_SITE_OTHER): Payer: Self-pay

## 2022-12-23 ENCOUNTER — Telehealth (INDEPENDENT_AMBULATORY_CARE_PROVIDER_SITE_OTHER): Payer: Self-pay | Admitting: Family Medicine

## 2022-12-23 NOTE — Telephone Encounter (Signed)
Pt is calling in stating she would like to discuss the medication Ozempic and some of the things that she has noticed while taking it.  Concerns about her blood sugars change.  Pt is also needing to have her Rx: Vitamin D refilled Pharm: [facility] [address].

## 2022-12-23 NOTE — Telephone Encounter (Signed)
Patient already contacted

## 2022-12-23 NOTE — Telephone Encounter (Signed)
Spoke w/ pt, concerned about Ozempic. She is having leg numbess  for 2 weeks, especially Rt thigh x 2 days. Her blood sugars after eating 129, 118, 130, and 141. Would like to stop Ozempic. Ok to stop Ozempic and contact PCP regarding the leg numbness. Pt voiced understanding-CS

## 2022-12-25 ENCOUNTER — Encounter: Payer: Self-pay | Admitting: Obstetrics & Gynecology

## 2022-12-25 ENCOUNTER — Other Ambulatory Visit (HOSPITAL_COMMUNITY)
Admission: RE | Admit: 2022-12-25 | Discharge: 2022-12-25 | Disposition: A | Discharge status: Home / Self Care | Payer: Medicare Other | Source: Ambulatory Visit | Attending: Obstetrics & Gynecology | Admitting: Obstetrics & Gynecology

## 2022-12-25 ENCOUNTER — Ambulatory Visit: Payer: Medicare Other | Admitting: Obstetrics & Gynecology

## 2022-12-25 VITALS — BP 119/80 | HR 81 | Ht 65.5 in | Wt 301.4 lb

## 2022-12-25 DIAGNOSIS — E282 Polycystic ovarian syndrome: Secondary | ICD-10-CM | POA: Diagnosis not present

## 2022-12-25 DIAGNOSIS — Z1151 Encounter for screening for human papillomavirus (HPV): Secondary | ICD-10-CM | POA: Diagnosis not present

## 2022-12-25 DIAGNOSIS — Z01419 Encounter for gynecological examination (general) (routine) without abnormal findings: Secondary | ICD-10-CM | POA: Insufficient documentation

## 2022-12-25 DIAGNOSIS — Z1231 Encounter for screening mammogram for malignant neoplasm of breast: Secondary | ICD-10-CM

## 2022-12-25 MED ORDER — MEDROXYPROGESTERONE ACETATE 10 MG PO TABS: 10.0000 mg | ORAL_TABLET | Freq: Every day | ORAL | 3 refills | Status: AC

## 2022-12-25 NOTE — Patient Instructions (Signed)
Please schedule a mammogram at one of the following locations:  [facility] in [place]:[phone] [address]  

## 2022-12-25 NOTE — Progress Notes (Signed)
WELL-WOMAN EXAMINATION Patient name: Natasha Lopez MRN [id number]  Date of birth: 02/03/1975 Chief Complaint:   Gynecologic Exam  History of Present Illness:   Natasha Lopez is a 47 y.o. G48P0060  female being seen today for a routine well-woman exam.   Menses used to be every month, but have been irregular this past year.  She did have a period ~ 2 wks ago, but prior to that it was about 150 days.  She uses GLO to track her period.  Notes h/o PCOS and has been on medication in the past to regulate her period, but would prefer not to ber on a medication if she does not have to be.   Some dysmenorrhea- tolerable.  Denies HMB  Denies abnormal discharge, itching or irritation.  Denies pelvic or abdominal pain.  She does note some vaginal dryness, she is not currently sexually active.    Breast lump: Patient reports that for several months now she is not felt a small lump above her right breast.  It is nontender.  It has not changed in size.  Patient's last menstrual period was 12/14/2022 (exact date). Denies issues with her menses The current method of family planning is abstinence.    Last pap collected today.  Last mammogram: ordered today. Last colonoscopy: 04/2022     12/25/2022    9:00 AM 10/26/2012    9:06 AM  Depression screen PHQ 2/9  Decreased Interest 3 0  Down, Depressed, Hopeless 3 0  PHQ - 2 Score 6 0  Altered sleeping 2   Tired, decreased energy 2   Change in appetite 3   Feeling bad or failure about yourself  3   Trouble concentrating 1   Moving slowly or fidgety/restless 0   Suicidal thoughts 0   PHQ-9 Score 17       Review of Systems:   Pertinent items are noted in HPI Denies any headaches, blurred vision, fatigue, shortness of breath, chest pain, abdominal pain, bowel movements, urination, or intercourse unless otherwise stated above.  Pertinent History Reviewed:  Reviewed past medical,surgical, social and family history.  Reviewed problem list,  medications and allergies. Physical Assessment:   Vitals:   12/25/22 0902  BP: 119/80  Pulse: 81  Weight: (!) 301 lb 6.4 oz (136.7 kg)  Height: 5' 5.5" (1.664 m)  Body mass index is 49.39 kg/m.        Physical Examination:   General appearance - well appearing, and in no distress  Mental status - alert, oriented to person, place, and time  Psych:  She has a normal mood and affect  Skin - warm and dry, normal color, no suspicious lesions noted.  Hirsuitism noted Right 1cm ?lipoma  Chest - effort normal, all lung fields clear to auscultation bilaterally  Heart - normal rate and regular rhythm  Neck:  midline trachea, no thyromegaly or nodules  Breasts - Above right breast- pea-sized firm non-tender mass noted ?lipoma breasts appear normal, no suspicious masses, no skin or nipple changes or  axillary nodes  Abdomen - obese, soft, nontender, nondistended, no masses or organomegaly  Pelvic - VULVA: normal appearing vulva with no masses, tenderness or lesions  VAGINA: normal appearing vagina with normal color and discharge, no lesions  CERVIX: normal appearing cervix without discharge or lesions, no CMT Bimanual exam limited due to body habitus- no acute abnormalities appreciated   Extremities:  No swelling or varicosities noted  Chaperone: Faith Rogue     Assessment &  Plan:  1) Well-Woman Exam -Pap collected, reviewed CCP guidelines -Mammogram ordered and plan to complete in [place] where she lives -Colonoscopy up-to-date  2) irregular menses, PCOS -Plan for Provera every 3 months as needed  3) breast ?lipoma -Suspect benign finding should she note any change in mass we will plan for follow-up imaging  Orders Placed This Encounter  Procedures   MM 3D SCREENING MAMMOGRAM BILATERAL BREAST    Meds:  Meds ordered this encounter  Medications   medroxyPROGESTERone (PROVERA) 10 MG tablet    Sig: Take 1 tablet (10 mg total) by mouth daily. Take medication if you have not  had a period within 3-4 mos    Dispense:  10 tablet    Refill:  3    Follow-up: Return in about 1 year (around 12/25/2023) for Annual.   Myna Hidalgo, DO Attending Obstetrician & Gynecologist, [facility], [facility]

## 2022-12-29 LAB — CYTOLOGY - PAP
Comment: NEGATIVE
Diagnosis: NEGATIVE
Diagnosis: REACTIVE
High risk HPV: NEGATIVE

## 2023-01-20 ENCOUNTER — Ambulatory Visit
Admission: RE | Admit: 2023-01-20 | Discharge: 2023-01-20 | Disposition: A | Discharge status: Home / Self Care | Payer: Medicare Other | Source: Ambulatory Visit | Attending: Obstetrics & Gynecology | Admitting: Obstetrics & Gynecology

## 2023-01-20 DIAGNOSIS — Z1231 Encounter for screening mammogram for malignant neoplasm of breast: Secondary | ICD-10-CM

## 2023-01-28 ENCOUNTER — Encounter (INDEPENDENT_AMBULATORY_CARE_PROVIDER_SITE_OTHER): Payer: Self-pay | Admitting: Family Medicine

## 2023-01-28 ENCOUNTER — Ambulatory Visit (INDEPENDENT_AMBULATORY_CARE_PROVIDER_SITE_OTHER): Payer: Medicare Other | Admitting: Family Medicine

## 2023-01-28 VITALS — BP 101/68 | HR 79 | Temp 97.7°F | Ht 65.0 in | Wt 289.0 lb

## 2023-01-28 DIAGNOSIS — E1159 Type 2 diabetes mellitus with other circulatory complications: Secondary | ICD-10-CM | POA: Diagnosis not present

## 2023-01-28 DIAGNOSIS — I152 Hypertension secondary to endocrine disorders: Secondary | ICD-10-CM | POA: Diagnosis not present

## 2023-01-28 DIAGNOSIS — Z7985 Long-term (current) use of injectable non-insulin antidiabetic drugs: Secondary | ICD-10-CM

## 2023-01-28 DIAGNOSIS — Z6841 Body Mass Index (BMI) 40.0 and over, adult: Secondary | ICD-10-CM

## 2023-01-28 DIAGNOSIS — E1165 Type 2 diabetes mellitus with hyperglycemia: Secondary | ICD-10-CM | POA: Diagnosis not present

## 2023-01-28 MED ORDER — ENALAPRIL MALEATE 5 MG PO TABS: 5.0000 mg | ORAL_TABLET | Freq: Every day | ORAL | 0 refills | Status: DC

## 2023-01-28 NOTE — Progress Notes (Signed)
SUBJECTIVE:  Chief Complaint: Obesity  Interim History: Patient celebrated the holidays going down to the beach with her best friend and her brother.  Her friend went back up to the [place] since then so patient has not done much.  Since last appointment she was journaling frequently for the first two weeks.  She was not monitoring calories but just logging to remember to stay close to meal plan.  She is trying to shift her mindset to eat for sustanence.  She cuts everything off at 7pm in terms of eating. The last two weeks she has not journaled much.  She hasn't strayed much since that time.   Natasha Lopez is here to discuss her progress with her obesity treatment plan. She is on the keeping a food journal and adhering to recommended goals of 1650-1800 calories and 120 grams of protein and states she is following her eating plan approximately 50 % of the time. She states she is not exercising, but was walking a lot.  OBJECTIVE: Visit Diagnoses: Problem List Items Addressed This Visit       Cardiovascular and Mediastinum   Hypertension associated with diabetes (HCC) - Primary   Blood pressure well controlled today.  No chest pain, chest pressure or headache.  She is on combination medication of atenolol-chlorthalidone, vasotec. Decrease enalapril to 5mg  daily  Follow up on BP at next appt.      Relevant Medications   enalapril (VASOTEC) 5 MG tablet     Endocrine   Type 2 diabetes mellitus with hyperglycemia (HCC)   Patient took ozempic 0.25mg  once and felt like it worked too well.  FBS were in the 110s.  She is nervous about her blood sugars bottoming out.  Will restart ozempic at 9 clicks instead of 18 and follow up on tolerance and blood sugars at next appt      Relevant Medications   enalapril (VASOTEC) 5 MG tablet     Other   Morbid obesity (HCC)   Starting weight of 315lbs in November of 2024.  She has been monitoring her food intake and trying to keep track of her calories and  protein but hasn't necessarily actually logged. She is recommitting the journaling plan.      Other Visit Diagnoses       BMI 45.0-49.9, adult (HCC)             01/28/2023    9:00 AM 12/25/2022    9:02 AM 12/17/2022    9:00 AM  Vitals with BMI  Height 5\' 5"  5' 5.5" 5\' 5"   Weight 289 lbs 301 lbs 6 oz 312 lbs  BMI 48.09 49.37 51.92  Systolic 101 119 425  Diastolic 68 80 66  Pulse 79 81 76    No data recorded  No data recorded  No data recorded  No data recorded    ASSESSMENT AND PLAN:  Diet: [place] is currently in the action stage of change. As such, her goal is to continue with weight loss efforts. She has agreed to keeping a food journal and adhering to recommended goals of 1650-1800 calories and 125 or more grams of protein. Patient to start food log or journaling meal plan.  The initial goal will be to habitually log or journal for at least 4 days a week.  The expectation it that patient may not initially meet calorie or protein goals as the nturitional understanding of food intake is begun.  We discussed the 10:1 ratio when reading a food label.  Patient agrees to keep a food log either electronically or on paper and bring to the next appointment to be able to dissect and discuss it with provider.    Exercise: Berry has been instructed that some exercise is better than none for weight loss and overall health benefits.   Behavior Modification:  We discussed the following Behavioral Modification Strategies today: increasing lean protein intake, increasing vegetables, meal planning and cooking strategies, keeping healthy foods in the home, planning for success, and keep a strict food journal.   No follow-ups on file.Marland Kitchen She was informed of the importance of frequent follow up visits to maximize her success with intensive lifestyle modifications for her multiple health conditions.  Attestation Statements:   Reviewed by clinician on day of visit: allergies, medications,  problem list, medical history, surgical history, family history, social history, and previous encounter notes.    Reuben Likes, MD

## 2023-01-28 NOTE — Assessment & Plan Note (Addendum)
Blood pressure well controlled today.  No chest pain, chest pressure or headache.  She is on combination medication of atenolol-chlorthalidone, vasotec. Decrease enalapril to 5mg  daily  Follow up on BP at next appt.

## 2023-01-28 NOTE — Assessment & Plan Note (Signed)
Patient took ozempic 0.25mg  once and felt like it worked too well.  FBS were in the 110s.  She is nervous about her blood sugars bottoming out.  Will restart ozempic at 9 clicks instead of 18 and follow up on tolerance and blood sugars at next appt

## 2023-01-29 ENCOUNTER — Other Ambulatory Visit (INDEPENDENT_AMBULATORY_CARE_PROVIDER_SITE_OTHER): Payer: Self-pay | Admitting: Family Medicine

## 2023-01-29 DIAGNOSIS — E1165 Type 2 diabetes mellitus with hyperglycemia: Secondary | ICD-10-CM

## 2023-02-03 NOTE — Assessment & Plan Note (Signed)
Starting weight of 315lbs in November of 2024.  She has been monitoring her food intake and trying to keep track of her calories and protein but hasn't necessarily actually logged. She is recommitting the journaling plan.

## 2023-02-10 ENCOUNTER — Ambulatory Visit (INDEPENDENT_AMBULATORY_CARE_PROVIDER_SITE_OTHER): Payer: Medicare Other | Admitting: Family Medicine

## 2023-02-10 ENCOUNTER — Encounter (INDEPENDENT_AMBULATORY_CARE_PROVIDER_SITE_OTHER): Payer: Self-pay | Admitting: Family Medicine

## 2023-02-10 VITALS — BP 95/64 | HR 76 | Temp 97.4°F | Ht 65.0 in | Wt 286.0 lb

## 2023-02-10 DIAGNOSIS — E1159 Type 2 diabetes mellitus with other circulatory complications: Secondary | ICD-10-CM | POA: Diagnosis not present

## 2023-02-10 DIAGNOSIS — Z7984 Long term (current) use of oral hypoglycemic drugs: Secondary | ICD-10-CM | POA: Diagnosis not present

## 2023-02-10 DIAGNOSIS — F32A Depression, unspecified: Secondary | ICD-10-CM

## 2023-02-10 DIAGNOSIS — E1165 Type 2 diabetes mellitus with hyperglycemia: Secondary | ICD-10-CM

## 2023-02-10 DIAGNOSIS — F419 Anxiety disorder, unspecified: Secondary | ICD-10-CM | POA: Diagnosis not present

## 2023-02-10 DIAGNOSIS — I152 Hypertension secondary to endocrine disorders: Secondary | ICD-10-CM

## 2023-02-10 DIAGNOSIS — Z6841 Body Mass Index (BMI) 40.0 and over, adult: Secondary | ICD-10-CM

## 2023-02-10 NOTE — Assessment & Plan Note (Signed)
 Patient voices that she has a lot of anxiety about taking Ozempic  so she has not restarted.  She mentions she has a feeling in the pit of her stomach that this medication is not for her.  Will hold on any refills at this time.

## 2023-02-10 NOTE — Assessment & Plan Note (Signed)
 Patient has cut her enalapril  in half.  BP very well managed today.  She denies chest pain, chest pressure and headache.  No dizziness or lightheadedness.  She can continue current meds and will likely decrease her tenoretic  at next appointment.

## 2023-02-10 NOTE — Progress Notes (Signed)
 SUBJECTIVE:  Chief Complaint: Obesity  Interim History: Patient had a rough couple of weeks.  She and her kids have had a really difficult time over the last few months.  She mentions that a bunch of taxes hit her all at once.  Financially things have been very tight.  They have eaten more out of the freezer than previously and the nutrition wasn't in line with what we have discussed here.  Next few weeks she is hoping will get more in line with the food choices we have previously discussed.  Chanah is here to discuss her progress with her obesity treatment plan. She is on the keeping a food journal and adhering to recommended goals of 1650-1800 calories and 125 grams of protein and states she is following her eating plan approximately 50 % of the time. She states she is not exercising.   OBJECTIVE: Visit Diagnoses: Problem List Items Addressed This Visit       Cardiovascular and Mediastinum   Hypertension associated with diabetes [facility])   Patient has cut her enalapril  in half.  BP very well managed today.  She denies chest pain, chest pressure and headache.  No dizziness or lightheadedness.  She can continue current meds and will likely decrease her tenoretic  at next appointment.        Endocrine   Type 2 diabetes mellitus with hyperglycemia (HCC) - Primary   Patient voices that she has a lot of anxiety about taking Ozempic  so she has not restarted.  She mentions she has a feeling in the pit of her stomach that this medication is not for her.  Will hold on any refills at this time.        Other   Morbid obesity (HCC)   Starting weight: 315 Peak weight: 406 BMR: 2333 Previous obesity management: na Body Fat %: 52.4% Starting Meal Plan: journaling Meal Plan needs: needs flexibility due to cost of food  Patient to more consistently journal regardless of whether or not she hits goals of calories and protein for the day.  The goal should be to have a better understanding of what  patient is taking in daily so we can better utilize and combined foods to get her closer to her total goal of calories and protein.      Anxiety and depression   Patient just recently started prozac  in addition to her vraylar .  She is waiting to get into therapy with the clinic that she prescribes her medication.  She called that clinic recently and was told to call back on Monday February 17th to schedule a new patient appointment.  Denies suicidal and homicidal ideation.      Relevant Medications   FLUoxetine  (PROZAC ) 20 MG capsule   Other Visit Diagnoses       BMI 45.0-49.9, adult (HCC)           No data recorded  No data recorded  No data recorded  No data recorded    ASSESSMENT AND PLAN:  Diet: [place] is currently in the action stage of change. As such, her goal is to continue with weight loss efforts. She has agreed to keeping a food journal and adhering to recommended goals of 1650-1800 calories and 125 or more grams of protein daily. Patient to start food log or journaling meal plan.  The initial goal will be to habitually log or journal for at least 4 days a week.  The expectation it that patient may not initially meet calorie or  protein goals as the nturitional understanding of food intake is begun.  We discussed the 10:1 ratio when reading a food label.  Patient agrees to keep a food log either electronically or on paper and bring to the next appointment to be able to dissect and discuss it with provider.    Exercise: Lynnley has been instructed that some exercise is better than none for weight loss and overall health benefits.   Behavior Modification:  We discussed the following Behavioral Modification Strategies today: increasing lean protein intake, increasing vegetables, and no skipping meals.  No follow-ups on file.SABRA She was informed of the importance of frequent follow up visits to maximize her success with intensive lifestyle modifications for her multiple  health conditions.  Attestation Statements:   Reviewed by clinician on day of visit: allergies, medications, problem list, medical history, surgical history, family history, social history, and previous encounter notes.  Adelita Cho, MD

## 2023-02-10 NOTE — Assessment & Plan Note (Signed)
 Patient just recently started prozac  in addition to her vraylar .  She is waiting to get into therapy with the clinic that she prescribes her medication.  She called that clinic recently and was told to call back on Monday February 17th to schedule a new patient appointment.  Denies suicidal and homicidal ideation.

## 2023-02-16 NOTE — Assessment & Plan Note (Signed)
Starting weight: 315 Peak weight: 406 BMR: 2333 Previous obesity management: na Body Fat %: 52.4% Starting Meal Plan: journaling Meal Plan needs: needs flexibility due to cost of food  Patient to more consistently journal regardless of whether or not she hits goals of calories and protein for the day.  The goal should be to have a better understanding of what patient is taking in daily so we can better utilize and combined foods to get her closer to her total goal of calories and protein.

## 2023-03-04 ENCOUNTER — Other Ambulatory Visit (INDEPENDENT_AMBULATORY_CARE_PROVIDER_SITE_OTHER): Payer: Self-pay | Admitting: Family Medicine

## 2023-03-04 DIAGNOSIS — I152 Hypertension secondary to endocrine disorders: Secondary | ICD-10-CM

## 2023-03-11 ENCOUNTER — Ambulatory Visit (INDEPENDENT_AMBULATORY_CARE_PROVIDER_SITE_OTHER): Payer: Medicare Other | Admitting: Family Medicine

## 2023-03-11 ENCOUNTER — Encounter (INDEPENDENT_AMBULATORY_CARE_PROVIDER_SITE_OTHER): Payer: Self-pay | Admitting: Family Medicine

## 2023-03-11 VITALS — BP 91/61 | HR 79 | Temp 98.1°F | Ht 65.0 in | Wt 281.0 lb

## 2023-03-11 DIAGNOSIS — I152 Hypertension secondary to endocrine disorders: Secondary | ICD-10-CM

## 2023-03-11 DIAGNOSIS — Z7984 Long term (current) use of oral hypoglycemic drugs: Secondary | ICD-10-CM | POA: Diagnosis not present

## 2023-03-11 DIAGNOSIS — E1159 Type 2 diabetes mellitus with other circulatory complications: Secondary | ICD-10-CM

## 2023-03-11 DIAGNOSIS — E559 Vitamin D deficiency, unspecified: Secondary | ICD-10-CM | POA: Diagnosis not present

## 2023-03-11 DIAGNOSIS — Z6841 Body Mass Index (BMI) 40.0 and over, adult: Secondary | ICD-10-CM

## 2023-03-11 DIAGNOSIS — E669 Obesity, unspecified: Secondary | ICD-10-CM

## 2023-03-11 MED ORDER — VITAMIN D (ERGOCALCIFEROL) 1.25 MG (50000 UNIT) PO CAPS: 50000.0000 [IU] | ORAL_CAPSULE | ORAL | 0 refills | Status: DC

## 2023-03-11 MED ORDER — ENALAPRIL MALEATE 5 MG PO TABS: 5.0000 mg | ORAL_TABLET | Freq: Every day | ORAL | Status: DC

## 2023-03-11 NOTE — Progress Notes (Signed)
   SUBJECTIVE:  Chief Complaint: Obesity  Interim History: Patient feels like she is messing up due to quite a bit of emotional turmoil.  She and her family had to get rid of her dog and the puppies she had.  Quite of emotional eating has taken place recently with letting the last two dogs go to the shelter.  Patient's best friend is still up [place] in [place].  She did take in more fried food over the last month.  She reports daily intake of frozen chicken patties and sandwiches of those.  She is still skipping breakfast daily and doing one meal daily. No upcoming plans for events or activities.   Natasha Lopez is here to discuss her progress with her obesity treatment plan. She is on the keeping a food journal and adhering to recommended goals of 1650-1800 calories and 125 grams of protein and states she is following her eating plan approximately 25 % of the time. She states she is not exercising.   OBJECTIVE: Visit Diagnoses: Problem List Items Addressed This Visit       Cardiovascular and Mediastinum   Hypertension associated with diabetes (HCC)   Patient has not been cutting enalapril pill in half.  She mentions she forgot to halve dose but has enough medication at this time.  Patient encouraged to cut enalapril in half and continue current dose of vasotec.  Will follow up on BP at next appointment.      Relevant Medications   enalapril (VASOTEC) 5 MG tablet     Other   Vitamin D deficiency   Patient did well when she was on prescription strength Vitamin D.  Does need a refill today.  Increased fatigue recently.      Relevant Medications   Vitamin D, Ergocalciferol, (DRISDOL) 1.25 MG (50000 UNIT) CAPS capsule    Vitals Temp: 98.1 F (36.7 C) BP: 91/61 Pulse Rate: 79 SpO2: 98 %   Anthropometric Measurements Height: 5\' 5"  (1.651 m) Weight: 281 lb (127.5 kg) BMI (Calculated): 46.76 Weight at Last Visit: 286 lb Weight Lost Since Last Visit: 5 Weight Gained Since Last Visit:  0 Starting Weight: 315 lb Total Weight Loss (lbs): 34 lb (15.4 kg)   Body Composition  Body Fat %: 48.3 % Fat Mass (lbs): 136 lbs Muscle Mass (lbs): 138.2 lbs Total Body Water (lbs): 97.6 lbs Visceral Fat Rating : 16   Other Clinical Data Today's Visit #: 6 Starting Date: 11/18/22 Comments: 1650-1800/125+     ASSESSMENT AND PLAN:  Diet: [name] is currently in the action stage of change. As such, her goal is to continue with weight loss efforts and has agreed to keeping a food journal and adhering to recommended goals of 1650-1800 calories and 125 or more grams protein daily.   Exercise:  No exercise has been prescribed at this time.  Behavior Modification:  We discussed the following Behavioral Modification Strategies today: increasing lean protein intake, no skipping meals, meal planning and cooking strategies, and emotional eating strategies .   No follow-ups on file.Marland Kitchen She was informed of the importance of frequent follow up visits to maximize her success with intensive lifestyle modifications for her multiple health conditions.  Attestation Statements:   Reviewed by clinician on day of visit: allergies, medications, problem list, medical history, surgical history, family history, social history, and previous encounter notes.     Natasha Likes, MD

## 2023-03-11 NOTE — Assessment & Plan Note (Signed)
 Patient did well when she was on prescription strength Vitamin D.  Does need a refill today.  Increased fatigue recently.

## 2023-03-11 NOTE — Assessment & Plan Note (Signed)
 Patient has not been cutting enalapril pill in half.  She mentions she forgot to halve dose but has enough medication at this time.  Patient encouraged to cut enalapril in half and continue current dose of vasotec.  Will follow up on BP at next appointment.

## 2023-03-23 ENCOUNTER — Encounter (HOSPITAL_BASED_OUTPATIENT_CLINIC_OR_DEPARTMENT_OTHER): Payer: Self-pay | Admitting: Emergency Medicine

## 2023-03-23 DIAGNOSIS — Z5321 Procedure and treatment not carried out due to patient leaving prior to being seen by health care provider: Secondary | ICD-10-CM | POA: Insufficient documentation

## 2023-03-23 DIAGNOSIS — R519 Headache, unspecified: Secondary | ICD-10-CM | POA: Insufficient documentation

## 2023-03-23 DIAGNOSIS — R110 Nausea: Secondary | ICD-10-CM | POA: Insufficient documentation

## 2023-03-23 NOTE — ED Triage Notes (Signed)
 Headache, left side frontal, into back of neck Some nausea  Started last night Didn't take any OTC meds for headache

## 2023-03-24 ENCOUNTER — Emergency Department (HOSPITAL_BASED_OUTPATIENT_CLINIC_OR_DEPARTMENT_OTHER)
Admission: EM | Admit: 2023-03-24 | Discharge: 2023-03-24 | Discharge status: Against Medical Advice | Attending: Emergency Medicine | Admitting: Emergency Medicine

## 2023-03-24 NOTE — ED Notes (Signed)
 Three attempts at making notification with pt for room placement. Eloped from waiting area.

## 2023-04-01 ENCOUNTER — Encounter (INDEPENDENT_AMBULATORY_CARE_PROVIDER_SITE_OTHER): Payer: Self-pay | Admitting: Family Medicine

## 2023-04-01 ENCOUNTER — Other Ambulatory Visit (INDEPENDENT_AMBULATORY_CARE_PROVIDER_SITE_OTHER): Payer: Self-pay | Admitting: Family Medicine

## 2023-04-01 ENCOUNTER — Ambulatory Visit (INDEPENDENT_AMBULATORY_CARE_PROVIDER_SITE_OTHER): Admitting: Family Medicine

## 2023-04-01 VITALS — BP 95/65 | HR 77 | Temp 97.6°F | Ht 65.0 in | Wt 272.0 lb

## 2023-04-01 DIAGNOSIS — E1159 Type 2 diabetes mellitus with other circulatory complications: Secondary | ICD-10-CM | POA: Diagnosis not present

## 2023-04-01 DIAGNOSIS — E785 Hyperlipidemia, unspecified: Secondary | ICD-10-CM | POA: Diagnosis not present

## 2023-04-01 DIAGNOSIS — E559 Vitamin D deficiency, unspecified: Secondary | ICD-10-CM

## 2023-04-01 DIAGNOSIS — I152 Hypertension secondary to endocrine disorders: Secondary | ICD-10-CM

## 2023-04-01 DIAGNOSIS — E1165 Type 2 diabetes mellitus with hyperglycemia: Secondary | ICD-10-CM

## 2023-04-01 DIAGNOSIS — E1169 Type 2 diabetes mellitus with other specified complication: Secondary | ICD-10-CM

## 2023-04-01 DIAGNOSIS — Z7984 Long term (current) use of oral hypoglycemic drugs: Secondary | ICD-10-CM | POA: Diagnosis not present

## 2023-04-01 DIAGNOSIS — Z6841 Body Mass Index (BMI) 40.0 and over, adult: Secondary | ICD-10-CM

## 2023-04-01 MED ORDER — ATENOLOL-CHLORTHALIDONE 50-25 MG PO TABS
0.5000 | ORAL_TABLET | Freq: Every evening | ORAL | Status: DC
Start: 2023-04-01 — End: 2023-06-07

## 2023-04-01 MED ORDER — VITAMIN D (ERGOCALCIFEROL) 1.25 MG (50000 UNIT) PO CAPS: 50000.0000 [IU] | ORAL_CAPSULE | ORAL | 0 refills | Status: DC

## 2023-04-01 MED ORDER — ENALAPRIL MALEATE 5 MG PO TABS: 5.0000 mg | ORAL_TABLET | Freq: Every day | ORAL | 0 refills | Status: DC

## 2023-04-01 NOTE — Assessment & Plan Note (Signed)
 Patient has been doing well with decreasing her total carbohydrate intake and taking her farxiga and her glucophage.  Last A1c elevated.  Will repeat A1c and Insulin level today.

## 2023-04-01 NOTE — Assessment & Plan Note (Signed)
 Patient is on pravachol daily.  She is not experiencing transaminitis or myalgias.  Last LDL close to goal but not at goal yet.  Repeat FLP today.

## 2023-04-01 NOTE — Assessment & Plan Note (Addendum)
 BP low normal today.  She is noticing more frequent BP drops at home when taking her BP when she feels shaky or lightheaded.  Will decrease tenoretic to half a tab daily.  Continue with enalapril at half a tab daily.  CMP today.

## 2023-04-01 NOTE — Progress Notes (Signed)
 SUBJECTIVE:  Chief Complaint: Obesity  Interim History: Patient has not been journaling much but has been monitoring what she is eating.  She hasn't weighed or measured anything.  She has been taken in mostly sandwiches- mostly [place] sandwichs or tuna sandwiches.  She may do something like salmon every so often.  She has been skipping breakfast not on purpose but just due to transportation challenges. Not sure what the next few weeks are going to look like- dealing with electricity issues with her stove.   Natasha Lopez is here to discuss her progress with her obesity treatment plan. She is on the keeping a food journal and adhering to recommended goals of 1650-1800 calories and 125 grams of protein and states she is following her eating plan approximately 70 % of the time. She states she is not exercising.  OBJECTIVE: Visit Diagnoses: Problem List Items Addressed This Visit       Cardiovascular and Mediastinum   Hypertension associated with diabetes (HCC) - Primary   BP low normal today.  She is noticing more frequent BP drops at home when taking her BP when she feels shaky or lightheaded.  Will decrease tenoretic to half a tab daily.  Continue with enalapril at half a tab daily.  CMP today.      Relevant Medications   enalapril (VASOTEC) 5 MG tablet   atenolol-chlorthalidone (TENORETIC) 50-25 MG tablet   Other Relevant Orders   Comprehensive metabolic panel with GFR     Endocrine   Type 2 diabetes mellitus with hyperglycemia (HCC)   Patient has been doing well with decreasing her total carbohydrate intake and taking her farxiga and her glucophage.  Last A1c elevated.  Will repeat A1c and Insulin level today.      Relevant Medications   enalapril (VASOTEC) 5 MG tablet   atenolol-chlorthalidone (TENORETIC) 50-25 MG tablet   Other Relevant Orders   Hemoglobin A1c   Insulin, random   Hyperlipidemia associated with type 2 diabetes mellitus (HCC)   Patient is on pravachol daily.  She is  not experiencing transaminitis or myalgias.  Last LDL close to goal but not at goal yet.  Repeat FLP today.      Relevant Medications   enalapril (VASOTEC) 5 MG tablet   atenolol-chlorthalidone (TENORETIC) 50-25 MG tablet   Other Relevant Orders   Lipid Panel With LDL/HDL Ratio     Other   Morbid obesity (HCC)   Vitamin D deficiency   Relevant Medications   Vitamin D, Ergocalciferol, (DRISDOL) 1.25 MG (50000 UNIT) CAPS capsule   Other Relevant Orders   VITAMIN D 25 Hydroxy (Vit-D Deficiency, Fractures)   Other Visit Diagnoses       BMI 45.0-49.9, adult (HCC)           Vitals Temp: 97.6 F (36.4 C) BP: 95/65 Pulse Rate: 77 SpO2: 99 %   Anthropometric Measurements Height: 5\' 5"  (1.651 m) Weight: 272 lb (123.4 kg) BMI (Calculated): 45.26 Weight at Last Visit: 281 lb Weight Lost Since Last Visit: 9 Weight Gained Since Last Visit: 0 Starting Weight: 315 lb Total Weight Loss (lbs): 43 lb (19.5 kg)   Body Composition  Body Fat %: 46.6 % Fat Mass (lbs): 127.2 lbs Muscle Mass (lbs): 138.2 lbs Total Body Water (lbs): 95 lbs Visceral Fat Rating : 15   Other Clinical Data Fasting: yes Labs: ? Today's Visit #: 7 Starting Date: 11/18/22 Comments: 1650-1800/125     ASSESSMENT AND PLAN:  Diet: Natasha Lopez is currently in the action  stage of change. As such, her goal is to continue with weight loss efforts and has agreed to keeping a food journal and adhering to recommended goals of 1650-1800 calories and 125 or more grams protein daily. Patient to start food log or journaling meal plan.  The initial goal will be to habitually log or journal for at least 4 days a week.  The expectation it that patient may not initially meet calorie or protein goals as the nturitional understanding of food intake is begun.  We discussed the 10:1 ratio when reading a food label.  Patient agrees to keep a food log either electronically or on paper and bring to the next appointment to be able to  dissect and discuss it with provider.    Exercise:  All adults should avoid inactivity. Some activity is better than none, and adults who participate in any amount of physical activity, gain some health benefits.  Behavior Modification:  We discussed the following Behavioral Modification Strategies today: increasing lean protein intake, increasing vegetables, meal planning and cooking strategies, keeping healthy foods in the home, and keep a strict food journal.   Return in about 4 weeks (around 04/29/2023).Marland Kitchen She was informed of the importance of frequent follow up visits to maximize her success with intensive lifestyle modifications for her multiple health conditions.  Attestation Statements:   Reviewed by clinician on day of visit: allergies, medications, problem list, medical history, surgical history, family history, social history, and previous encounter notes.    Reuben Likes, MD

## 2023-04-03 LAB — LIPID PANEL WITH LDL/HDL RATIO
Cholesterol, Total: 123 mg/dL (ref 100–199)
HDL: 32 mg/dL — ABNORMAL LOW (ref >39)
LDL Chol Calc (NIH): 65 mg/dL (ref 0–99)
LDL/HDL Ratio: 2 ratio (ref 0.0–3.2)
Triglycerides: 146 mg/dL (ref 0–149)
VLDL Cholesterol Cal: 26 mg/dL (ref 5–40)

## 2023-04-03 LAB — COMPREHENSIVE METABOLIC PANEL WITH GFR
ALT: 24 IU/L (ref 0–32)
AST: 17 IU/L (ref 0–40)
Albumin: 4.3 g/dL (ref 3.9–4.9)
Alkaline Phosphatase: 76 IU/L (ref 44–121)
BUN/Creatinine Ratio: 18 (ref 9–23)
BUN: 20 mg/dL (ref 6–24)
Bilirubin Total: 0.4 mg/dL (ref 0.0–1.2)
CO2: 22 mmol/L (ref 20–29)
Calcium: 10 mg/dL (ref 8.7–10.2)
Chloride: 98 mmol/L (ref 96–106)
Creatinine, Ser: 1.14 mg/dL — ABNORMAL HIGH (ref 0.57–1.00)
Globulin, Total: 2.7 g/dL (ref 1.5–4.5)
Glucose: 103 mg/dL — ABNORMAL HIGH (ref 70–99)
Potassium: 3.9 mmol/L (ref 3.5–5.2)
Sodium: 141 mmol/L (ref 134–144)
Total Protein: 7 g/dL (ref 6.0–8.5)
eGFR: 60 mL/min/{1.73_m2} (ref >59)

## 2023-04-03 LAB — HEMOGLOBIN A1C
Est. average glucose Bld gHb Est-mCnc: 134 mg/dL
Hgb A1c MFr Bld: 6.3 % — ABNORMAL HIGH (ref 4.8–5.6)

## 2023-04-03 LAB — INSULIN, RANDOM: INSULIN: 31 u[IU]/mL — ABNORMAL HIGH (ref 2.6–24.9)

## 2023-04-03 LAB — VITAMIN D 25 HYDROXY (VIT D DEFICIENCY, FRACTURES): Vit D, 25-Hydroxy: 41.3 ng/mL (ref 30.0–100.0)

## 2023-04-30 ENCOUNTER — Emergency Department (HOSPITAL_BASED_OUTPATIENT_CLINIC_OR_DEPARTMENT_OTHER)
Admission: EM | Admit: 2023-04-30 | Discharge: 2023-05-01 | Disposition: A | Discharge status: Home / Self Care | Attending: Emergency Medicine | Admitting: Emergency Medicine

## 2023-04-30 ENCOUNTER — Other Ambulatory Visit: Payer: Self-pay

## 2023-04-30 ENCOUNTER — Emergency Department (HOSPITAL_BASED_OUTPATIENT_CLINIC_OR_DEPARTMENT_OTHER)

## 2023-04-30 DIAGNOSIS — R0789 Other chest pain: Secondary | ICD-10-CM | POA: Insufficient documentation

## 2023-04-30 DIAGNOSIS — I10 Essential (primary) hypertension: Secondary | ICD-10-CM | POA: Diagnosis not present

## 2023-04-30 DIAGNOSIS — Z7984 Long term (current) use of oral hypoglycemic drugs: Secondary | ICD-10-CM | POA: Insufficient documentation

## 2023-04-30 DIAGNOSIS — E119 Type 2 diabetes mellitus without complications: Secondary | ICD-10-CM | POA: Diagnosis not present

## 2023-04-30 DIAGNOSIS — Z79899 Other long term (current) drug therapy: Secondary | ICD-10-CM | POA: Insufficient documentation

## 2023-04-30 DIAGNOSIS — R079 Chest pain, unspecified: Secondary | ICD-10-CM | POA: Diagnosis not present

## 2023-04-30 LAB — BASIC METABOLIC PANEL WITH GFR
Anion gap: 13 (ref 5–15)
BUN: 27 mg/dL — ABNORMAL HIGH (ref 6–20)
CO2: 24 mmol/L (ref 22–32)
Calcium: 10 mg/dL (ref 8.9–10.3)
Chloride: 101 mmol/L (ref 98–111)
Creatinine, Ser: 1.47 mg/dL — ABNORMAL HIGH (ref 0.44–1.00)
GFR, Estimated: 44 mL/min — ABNORMAL LOW (ref >60)
Glucose, Bld: 193 mg/dL — ABNORMAL HIGH (ref 70–99)
Potassium: 3.7 mmol/L (ref 3.5–5.1)
Sodium: 138 mmol/L (ref 135–145)

## 2023-04-30 LAB — CBC
HCT: 42 % (ref 36.0–46.0)
Hemoglobin: 13.9 g/dL (ref 12.0–15.0)
MCH: 30.1 pg (ref 26.0–34.0)
MCHC: 33.1 g/dL (ref 30.0–36.0)
MCV: 90.9 fL (ref 80.0–100.0)
Platelets: 369 10*3/uL (ref 150–400)
RBC: 4.62 MIL/uL (ref 3.87–5.11)
RDW: 13.8 % (ref 11.5–15.5)
WBC: 11.5 10*3/uL — ABNORMAL HIGH (ref 4.0–10.5)
nRBC: 0 % (ref 0.0–0.2)

## 2023-04-30 LAB — TROPONIN T, HIGH SENSITIVITY: Troponin T High Sensitivity: <15 ng/L (ref <19)

## 2023-04-30 NOTE — ED Triage Notes (Signed)
 Pt POV reporting mid chest pain that began tonight after she lifted a heavy box, pain worsens with movement. Pt Denies SOB, diabetic.

## 2023-05-01 DIAGNOSIS — R0789 Other chest pain: Secondary | ICD-10-CM | POA: Diagnosis not present

## 2023-05-01 LAB — TROPONIN T, HIGH SENSITIVITY: Troponin T High Sensitivity: <15 ng/L (ref <19)

## 2023-05-01 MED ORDER — ACETAMINOPHEN 500 MG PO TABS
1000.0000 mg | ORAL_TABLET | Freq: Once | ORAL | Status: AC
  Administered 2023-05-01: 1000 mg via ORAL
  Filled 2023-05-01: qty 2

## 2023-05-01 NOTE — ED Notes (Signed)
 RN reviewed discharge instructions with pt. Pt verbalized understanding and had no further questions. VSS upon discharge.

## 2023-05-01 NOTE — ED Provider Notes (Signed)
 [facility] AT [facility]  Provider Note  CSN: [id number] Arrival date & time: 04/30/23 2134  History Chief Complaint  Patient presents with   Chest Pain    Natasha Lopez is a 48 y.o. female with history of HTN, DM reports sudden onset of R chest pain while lifting a heavy box a few hours prior to arrival. No SOB, worse with movement and deep breath.    Home Medications Prior to Admission medications   Medication Sig Start Date End Date Taking? Authorizing Provider  allopurinol (ZYLOPRIM) 300 MG tablet Take 300 mg by mouth every evening.    [provider]  atenolol -chlorthalidone  (TENORETIC ) 50-25 MG tablet Take 0.5 tablets by mouth every evening. 04/01/23   Jenean Minus, MD  dapagliflozin propanediol (FARXIGA) 10 MG TABS tablet Take 10 mg by mouth every evening.    [provider]  enalapril  (VASOTEC ) 5 MG tablet Take 1 tablet (5 mg total) by mouth daily. 1/2 tablet 04/01/23   Jenean Minus, MD  FLUoxetine (PROZAC) 20 MG capsule Take 20 mg by mouth daily. 02/09/23   [provider]  medroxyPROGESTERone  (PROVERA ) 10 MG tablet Take 1 tablet (10 mg total) by mouth daily. Take medication if you have not had a period within 3-4 mos 12/25/22   Ozan, Jennifer, DO  metFORMIN (GLUCOPHAGE-XR) 500 MG 24 hr tablet Take 1,500 mg by mouth every evening.    [provider]  pravastatin (PRAVACHOL) 20 MG tablet Take 20 mg by mouth every evening.    [provider]  PRESCRIPTION MEDICATION Pt has CPAP machine    [provider]  Vitamin D , Ergocalciferol , (DRISDOL ) 1.25 MG (50000 UNIT) CAPS capsule Take 1 capsule (50,000 Units total) by mouth every 7 (seven) days. 04/01/23   Jenean Minus, MD  VRAYLAR 1.5 MG capsule Take 1.5 mg by mouth. 09/18/22   [provider]     Allergies    Coconut (cocos nucifera)   Review of Systems   Review of Systems Please see HPI for pertinent positives  and negatives  Physical Exam BP 113/80   Pulse 86   Temp 98 F (36.7 C)   Resp 17   Ht 5\' 5"  (1.651 m)   Wt 122.9 kg   SpO2 98%   BMI 45.10 kg/m   Physical Exam Vitals and nursing note reviewed.  Constitutional:      Appearance: Normal appearance.  HENT:     Head: Normocephalic and atraumatic.     Nose: Nose normal.     Mouth/Throat:     Mouth: Mucous membranes are moist.  Eyes:     Extraocular Movements: Extraocular movements intact.     Conjunctiva/sclera: Conjunctivae normal.  Cardiovascular:     Rate and Rhythm: Normal rate.  Pulmonary:     Effort: Pulmonary effort is normal.     Breath sounds: Normal breath sounds.  Chest:     Chest wall: Tenderness (R chest wall, reproduces symptoms) present.  Abdominal:     General: Abdomen is flat.     Palpations: Abdomen is soft.     Tenderness: There is no abdominal tenderness.  Musculoskeletal:        General: No swelling. Normal range of motion.     Cervical back: Neck supple.  Skin:    General: Skin is warm and dry.  Neurological:     General: No focal deficit present.     Mental Status: She is alert.  Psychiatric:  Mood and Affect: Mood normal.     ED Results / Procedures / Treatments   EKG EKG Interpretation Date/Time:  Friday April 30 2023 21:44:56 EDT Ventricular Rate:  106 PR Interval:  156 QRS Duration:  86 QT Interval:  360 QTC Calculation: 478 R Axis:   65  Text Interpretation: Sinus tachycardia Nonspecific T wave abnormality Abnormal ECG compared with prior 3/23 no significant changes Confirmed by Racheal Buddle [phone]) on 04/30/2023 10:06:48 PM  Procedures Procedures  Medications Ordered in the ED Medications  acetaminophen  (TYLENOL ) tablet 1,000 mg (1,000 mg Oral Given 05/01/23 0150)    Initial Impression and Plan  Patient here with atypical, reproducible likely MSK R chest pain. Labs done in triage show unremarkable CBC, BMP with mildly increased creatinine from recent baseline (she  reports decreased PO intake today). Trop is normal. I personally viewed the images from radiology studies and agree with radiologist interpretation: CXR is clear. Repeat trop is pending. Recommend she stay hydrated and follow up with PCP for recheck of BMP in about a week. Avoid NSAIDs in the meantime. APAP for pain.   ED Course   Clinical Course as of 05/01/23 0225  Sat May 01, 2023  0212 Repeat Trop remains normal. Low concern for ACS given reproducible pain starting after lifting a heavy box. Recommend APAP for pain, hydration and PCP follow up as above. RTED for any other concerns.  [CS]    Clinical Course User Index [CS] Charmayne Cooper, MD     MDM Rules/Calculators/A&P Medical Decision Making Problems Addressed: Chest wall pain: acute illness or injury  Amount and/or Complexity of Data Reviewed Labs: ordered. Decision-making details documented in ED Course. Radiology: ordered and independent interpretation performed. Decision-making details documented in ED Course. ECG/medicine tests: ordered and independent interpretation performed. Decision-making details documented in ED Course.  Risk OTC drugs.     Final Clinical Impression(s) / ED Diagnoses Final diagnoses:  Chest wall pain    Rx / DC Orders ED Discharge Orders     None        Charmayne Cooper, MD 05/01/23 0225

## 2023-05-03 DIAGNOSIS — H40033 Anatomical narrow angle, bilateral: Secondary | ICD-10-CM | POA: Diagnosis not present

## 2023-05-03 DIAGNOSIS — E119 Type 2 diabetes mellitus without complications: Secondary | ICD-10-CM | POA: Diagnosis not present

## 2023-05-04 DIAGNOSIS — G4733 Obstructive sleep apnea (adult) (pediatric): Secondary | ICD-10-CM | POA: Diagnosis not present

## 2023-05-11 ENCOUNTER — Ambulatory Visit (INDEPENDENT_AMBULATORY_CARE_PROVIDER_SITE_OTHER): Admitting: Family Medicine

## 2023-05-11 ENCOUNTER — Other Ambulatory Visit (INDEPENDENT_AMBULATORY_CARE_PROVIDER_SITE_OTHER): Payer: Self-pay | Admitting: Family Medicine

## 2023-05-11 ENCOUNTER — Encounter (INDEPENDENT_AMBULATORY_CARE_PROVIDER_SITE_OTHER): Payer: Self-pay | Admitting: Family Medicine

## 2023-05-11 VITALS — BP 103/69 | HR 86 | Temp 98.3°F | Ht 65.0 in | Wt 273.0 lb

## 2023-05-11 DIAGNOSIS — Z6841 Body Mass Index (BMI) 40.0 and over, adult: Secondary | ICD-10-CM

## 2023-05-11 DIAGNOSIS — I152 Hypertension secondary to endocrine disorders: Secondary | ICD-10-CM

## 2023-05-11 DIAGNOSIS — Z7984 Long term (current) use of oral hypoglycemic drugs: Secondary | ICD-10-CM

## 2023-05-11 DIAGNOSIS — E1159 Type 2 diabetes mellitus with other circulatory complications: Secondary | ICD-10-CM | POA: Diagnosis not present

## 2023-05-11 DIAGNOSIS — E559 Vitamin D deficiency, unspecified: Secondary | ICD-10-CM | POA: Diagnosis not present

## 2023-05-11 DIAGNOSIS — Z5948 Other specified lack of adequate food: Secondary | ICD-10-CM | POA: Diagnosis not present

## 2023-05-11 MED ORDER — VITAMIN D (ERGOCALCIFEROL) 1.25 MG (50000 UNIT) PO CAPS: 50000.0000 [IU] | ORAL_CAPSULE | ORAL | 0 refills | Status: DC

## 2023-05-11 NOTE — Assessment & Plan Note (Signed)
 BP controlled today on half tab of tenoretic  daily.  Continue current medication plan with no changes in dose or meds.  No refills needed.

## 2023-05-11 NOTE — Progress Notes (Signed)
 SUBJECTIVE:  Chief Complaint: Obesity  Interim History: Patient has had a few stressful events since last appointment- she sprained a muscle in her chest wall and worried she was having a heart attack.  She was off her feet for about two weeks during that time.  She has been less conscious of food choices.  Her daughter had a nervous breakdown and leading up to that there was quite a bit of violent delusions.  Just lost food stamps so availability of food is limited.  Natasha Lopez is here to discuss her progress with her obesity treatment plan. She is on the keeping a food journal and adhering to recommended goals of 1650-1800 calories and 125 grams of protein and states she is following her eating plan approximately 25 % of the time. She states she is not exercising.  OBJECTIVE: Visit Diagnoses: Problem List Items Addressed This Visit       Cardiovascular and Mediastinum   Hypertension associated with diabetes (HCC) - Primary   BP controlled today on half tab of tenoretic  daily.  Continue current medication plan with no changes in dose or meds.  No refills needed.        Other   Morbid obesity (HCC)   Anthropometric Measurements Height: 5\' 5"  (1.651 m) Weight: 273 lb (123.8 kg) BMI (Calculated): 45.43 Weight at Last Visit: 272 lb Weight Lost Since Last Visit: 0 Weight Gained Since Last Visit: 1 Starting Weight: 315 lb Total Weight Loss (lbs): 42 lb (19.1 kg) Body Composition  Body Fat %: 48 % Fat Mass (lbs): 131 lbs Muscle Mass (lbs): 134.8 lbs Total Body Water (lbs): 99.2 lbs Visceral Fat Rating : 15 Other Clinical Data Today's Visit #: 8 Starting Date: 11/18/22 Comments: 1650-1800/125       Vitamin D  deficiency   On Rx Vitamin D  weekly.  She is not experiencing nausea, vomiting or muscle weakness.  Needs a refill today.      Relevant Medications   Vitamin D , Ergocalciferol , (DRISDOL ) 1.25 MG (50000 UNIT) CAPS capsule   Limited access to food   Patient has lost food  stamp coverage for her and her family. She mentions that she does not have access to much in terms of food except what is available to her.  Will refer to [employer] program for assistance in access to food.      Relevant Orders   AMBULATORY REFERRAL TO [facility]   Other Visit Diagnoses       BMI 45.0-49.9, adult (HCC)           No data recorded       05/11/2023   10:00 AM 05/01/2023    2:30 AM 05/01/2023    2:15 AM  Vitals with BMI  Height 5\' 5"     Weight 273 lbs    BMI 45.43    Systolic 103 124 409  Diastolic 69 78 74  Pulse 86 88 86      ASSESSMENT AND PLAN:  Diet: Natasha Lopez is currently in the action stage of change. As such, her goal is to continue with weight loss efforts and has agreed to keeping a food journal and adhering to recommended goals of 1650-1800 calories and 125 or more grams protein daily.  Patient to continue to go to the food pantries for access to consistent food.  Exercise:  All adults should avoid inactivity. Some activity is better than none, and adults who participate in any amount of physical activity, gain some health benefits.  Behavior Modification:  We discussed the following Behavioral Modification Strategies today: increasing lean protein intake, decreasing simple carbohydrates, increasing vegetables, meal planning and cooking strategies, keeping healthy foods in the home, avoiding temptations, and planning for success.   No follow-ups on file.   She was informed of the importance of frequent follow up visits to maximize her success with intensive lifestyle modifications for her multiple health conditions.  Attestation Statements:   Reviewed by clinician on day of visit: allergies, medications, problem list, medical history, surgical history, family history, social history, and previous encounter notes.     Donaciano Frizzle, MD

## 2023-05-11 NOTE — Assessment & Plan Note (Signed)
 On Rx Vitamin D  weekly.  She is not experiencing nausea, vomiting or muscle weakness.  Needs a refill today.

## 2023-05-17 DIAGNOSIS — Z5948 Other specified lack of adequate food: Secondary | ICD-10-CM | POA: Insufficient documentation

## 2023-05-17 NOTE — Assessment & Plan Note (Signed)
 Patient has lost food stamp coverage for her and her family. She mentions that she does not have access to much in terms of food except what is available to her.  Will refer to [employer] program for assistance in access to food.

## 2023-05-17 NOTE — Assessment & Plan Note (Signed)
 Anthropometric Measurements Height: 5\' 5"  (1.651 m) Weight: 273 lb (123.8 kg) BMI (Calculated): 45.43 Weight at Last Visit: 272 lb Weight Lost Since Last Visit: 0 Weight Gained Since Last Visit: 1 Starting Weight: 315 lb Total Weight Loss (lbs): 42 lb (19.1 kg) Body Composition  Body Fat %: 48 % Fat Mass (lbs): 131 lbs Muscle Mass (lbs): 134.8 lbs Total Body Water (lbs): 99.2 lbs Visceral Fat Rating : 15 Other Clinical Data Today's Visit #: 8 Starting Date: 11/18/22 Comments: 1650-1800/125

## 2023-06-02 DIAGNOSIS — I10 Essential (primary) hypertension: Secondary | ICD-10-CM | POA: Diagnosis not present

## 2023-06-02 DIAGNOSIS — E1169 Type 2 diabetes mellitus with other specified complication: Secondary | ICD-10-CM | POA: Diagnosis not present

## 2023-06-07 ENCOUNTER — Encounter (INDEPENDENT_AMBULATORY_CARE_PROVIDER_SITE_OTHER): Payer: Self-pay | Admitting: Family Medicine

## 2023-06-07 ENCOUNTER — Ambulatory Visit (INDEPENDENT_AMBULATORY_CARE_PROVIDER_SITE_OTHER): Admitting: Family Medicine

## 2023-06-07 VITALS — BP 92/65 | HR 82 | Temp 98.1°F | Ht 65.0 in | Wt 274.0 lb

## 2023-06-07 DIAGNOSIS — Z7984 Long term (current) use of oral hypoglycemic drugs: Secondary | ICD-10-CM

## 2023-06-07 DIAGNOSIS — Z6841 Body Mass Index (BMI) 40.0 and over, adult: Secondary | ICD-10-CM

## 2023-06-07 DIAGNOSIS — I152 Hypertension secondary to endocrine disorders: Secondary | ICD-10-CM

## 2023-06-07 DIAGNOSIS — E1159 Type 2 diabetes mellitus with other circulatory complications: Secondary | ICD-10-CM | POA: Diagnosis not present

## 2023-06-07 DIAGNOSIS — Z5948 Other specified lack of adequate food: Secondary | ICD-10-CM | POA: Diagnosis not present

## 2023-06-07 MED ORDER — CHLORTHALIDONE 12.5 MG PO TABS: 1.0000 | ORAL_TABLET | Freq: Every day | ORAL | 0 refills | Status: DC

## 2023-06-07 NOTE — Progress Notes (Signed)
 SUBJECTIVE:  Chief Complaint: Obesity  Interim History: patient just got her phone fixed after a recent loss of service.  She just got her food stamps cut and is hoping to get back on assistance soon- was told she lost coverage due to her daughter losing her job.  She hasn't journaled since last appointment.  She feels guilty writing down what she is eating. Hoping to get the food stamp situation remedied today.    Natasha Lopez is here to discuss her progress with her obesity treatment plan. She is on the keeping a food journal and adhering to recommended goals of 1650-1800 calories and 125 grams of protein and states she is following her eating plan approximately 25 % of the time. She states she is not exercising.  OBJECTIVE: Visit Diagnoses: Problem List Items Addressed This Visit       Cardiovascular and Mediastinum   Hypertension associated with diabetes (HCC) - Primary   BP low and controlled today.  She has had a few episodes of lightheadedness and dizziness today. Has not experienced any falls.  Will stop atenolol - chlorthalidone  combo and stay on 12.5mg  of chlorthalidone  and 2.5mg  enalapril  daily.  Follow up on BP at next appointment.      Relevant Medications   Chlorthalidone  12.5 MG TABS     Other   Morbid obesity (HCC)   Anthropometric Measurements Height: 5\' 5"  (1.651 m) Weight: 274 lb (124.3 kg) BMI (Calculated): 45.6 Weight at Last Visit: 272 lb Weight Lost Since Last Visit: 0 Weight Gained Since Last Visit: 1 Starting Weight: 315 lb Total Weight Loss (lbs): 41 lb (18.6 kg) Body Composition  Body Fat %: 46.7 % Fat Mass (lbs): 128 lbs Muscle Mass (lbs): 138.8 lbs Total Body Water (lbs): 97 lbs Visceral Fat Rating : 15 Other Clinical Data Fasting: yes Labs: no Today's Visit #: 9 Starting Date: 11/18/22 Comments: 1650-1800/125       Limited access to food   Patient voices that she is reapplying for food stamps today.  She is hopeful to get this figured out  today as to not miss out on another month of food access.      Other Visit Diagnoses       BMI 45.0-49.9, adult (HCC)           No data recorded       06/07/2023   10:00 AM 05/11/2023   10:00 AM 05/01/2023    2:30 AM  Vitals with BMI  Height 5\' 5"  5\' 5"    Weight 274 lbs 273 lbs   BMI 45.6 45.43   Systolic 92 103 124  Diastolic 65 69 78  Pulse 82 86 88      ASSESSMENT AND PLAN:  Diet: [name] is currently in the action stage of change. As such, her goal is to continue with weight loss efforts and has agreed to keeping a food journal and adhering to recommended goals of 1650-1800 calories and 125 or more grams of protein daily.  Patient to start food log or journaling meal plan.  The initial goal will be to habitually log or journal for at least 4 days a week.  The expectation it that patient may not initially meet calorie or protein goals as the nturitional understanding of food intake is begun.  We discussed the 10:1 ratio when reading a food label.  Patient agrees to keep a food log either electronically or on paper and bring to the next appointment to be able to dissect and discuss  it with provider.    Exercise:  For substantial health benefits, adults should do at least 150 minutes (2 hours and 30 minutes) a week of moderate-intensity, or 75 minutes (1 hour and 15 minutes) a week of vigorous-intensity aerobic physical activity, or an equivalent combination of moderate- and vigorous-intensity aerobic activity. Aerobic activity should be performed in episodes of at least 10 minutes, and preferably, it should be spread throughout the week.  She wants to implement some walking 3-4 times a week.  Behavior Modification:  We discussed the following Behavioral Modification Strategies today: increasing lean protein intake, decreasing simple carbohydrates, increasing vegetables, meal planning and cooking strategies, planning for success, and keep a strict food journal.   Return in  about 3 weeks (around 06/28/2023).   She was informed of the importance of frequent follow up visits to maximize her success with intensive lifestyle modifications for her multiple health conditions.  Attestation Statements:   Reviewed by clinician on day of visit: allergies, medications, problem list, medical history, surgical history, family history, social history, and previous encounter notes.     Donaciano Frizzle, MD

## 2023-06-07 NOTE — Assessment & Plan Note (Signed)
 BP low and controlled today.  She has had a few episodes of lightheadedness and dizziness today. Has not experienced any falls.  Will stop atenolol - chlorthalidone  combo and stay on 12.5mg  of chlorthalidone  and 2.5mg  enalapril  daily.  Follow up on BP at next appointment.

## 2023-06-14 NOTE — Assessment & Plan Note (Signed)
 Anthropometric Measurements Height: 5\' 5"  (1.651 m) Weight: 274 lb (124.3 kg) BMI (Calculated): 45.6 Weight at Last Visit: 272 lb Weight Lost Since Last Visit: 0 Weight Gained Since Last Visit: 1 Starting Weight: 315 lb Total Weight Loss (lbs): 41 lb (18.6 kg) Body Composition  Body Fat %: 46.7 % Fat Mass (lbs): 128 lbs Muscle Mass (lbs): 138.8 lbs Total Body Water (lbs): 97 lbs Visceral Fat Rating : 15 Other Clinical Data Fasting: yes Labs: no Today's Visit #: 9 Starting Date: 11/18/22 Comments: 1650-1800/125

## 2023-06-14 NOTE — Assessment & Plan Note (Signed)
 Patient voices that she is reapplying for food stamps today.  She is hopeful to get this figured out today as to not miss out on another month of food access.

## 2023-07-06 ENCOUNTER — Encounter (INDEPENDENT_AMBULATORY_CARE_PROVIDER_SITE_OTHER): Payer: Self-pay | Admitting: Family Medicine

## 2023-07-06 ENCOUNTER — Ambulatory Visit (INDEPENDENT_AMBULATORY_CARE_PROVIDER_SITE_OTHER): Admitting: Family Medicine

## 2023-07-06 VITALS — BP 93/64 | HR 75 | Temp 97.7°F | Ht 65.0 in | Wt 273.0 lb

## 2023-07-06 DIAGNOSIS — E1159 Type 2 diabetes mellitus with other circulatory complications: Secondary | ICD-10-CM | POA: Diagnosis not present

## 2023-07-06 DIAGNOSIS — Z7984 Long term (current) use of oral hypoglycemic drugs: Secondary | ICD-10-CM | POA: Diagnosis not present

## 2023-07-06 DIAGNOSIS — I152 Hypertension secondary to endocrine disorders: Secondary | ICD-10-CM | POA: Diagnosis not present

## 2023-07-06 DIAGNOSIS — E1165 Type 2 diabetes mellitus with hyperglycemia: Secondary | ICD-10-CM

## 2023-07-06 DIAGNOSIS — Z6841 Body Mass Index (BMI) 40.0 and over, adult: Secondary | ICD-10-CM

## 2023-07-06 MED ORDER — ENALAPRIL MALEATE 5 MG PO TABS: 5.0000 mg | ORAL_TABLET | Freq: Every day | ORAL | 0 refills | Status: AC

## 2023-07-06 NOTE — Assessment & Plan Note (Signed)
 Patient was unable to halve her chlorthalidone  25mg  due to size of pill.  Has been taking atenolol /chlorthalidone .  Will stop meds and follow up on BP at next appointment.

## 2023-07-06 NOTE — Progress Notes (Signed)
 SUBJECTIVE:  Chief Complaint: Obesity  Interim History: Today is patient's birthday! She has been doing almost exclusively food pantry food. She doesn't have much in terms choice for food because she is eating what is at hte pantry.  She is hoping to reapply for food stamps tomorrow.  No plans for the next month.  Natasha Lopez is here to discuss her progress with her obesity treatment plan. She is on the keeping a food journal and adhering to recommended goals of 1650-1800 calories and 125 grams of protein and states she is following her eating plan approximately 90 % of the time. She states she is Walk at home 15-30 minutes 2-3 times per week.   OBJECTIVE: Visit Diagnoses: Problem List Items Addressed This Visit       Cardiovascular and Mediastinum   Hypertension associated with diabetes (HCC)   Patient was unable to halve her chlorthalidone  25mg  due to size of pill.  Has been taking atenolol /chlorthalidone .  Will stop meds and follow up on BP at next appointment.      Relevant Medications   enalapril  (VASOTEC ) 5 MG tablet     Endocrine   Type 2 diabetes mellitus with hyperglycemia (HCC) - Primary   Patient is intermittently checking her blood sugars and they are around 120s- these are not fasting numbers.  She is on farxiga and metformin.  No recent changes in her meds and no refills needed.      Relevant Medications   enalapril  (VASOTEC ) 5 MG tablet     Other   Morbid obesity (HCC)   Other Visit Diagnoses       BMI 45.0-49.9, adult (HCC)           Vitals Temp: 97.7 F (36.5 C) BP: 93/64 Pulse Rate: 75 SpO2: 98 %   Anthropometric Measurements Height: 5' 5 (1.651 m) Weight: 273 lb (123.8 kg) BMI (Calculated): 45.43 Weight at Last Visit: 272 lb Weight Lost Since Last Visit: 1 Weight Gained Since Last Visit: 0 Starting Weight: 315 lb Total Weight Loss (lbs): 42 lb (19.1 kg)   Body Composition  Body Fat %: 48 % Fat Mass (lbs): 131.4 lbs Muscle Mass (lbs):  135.2 lbs Total Body Water (lbs): 101.8 lbs Visceral Fat Rating : 16   Other Clinical Data Fasting: yes Labs: no Today's Visit #: 10 Starting Date: 11/18/22 Comments: 1650-1800/125     ASSESSMENT AND PLAN:  Diet: Jamyla is currently in the action stage of change. As such, her goal is to continue with weight loss efforts and has agreed to keeping a food journal and adhering to recommended goals of 1450-1600 calories and 95 or more grams of protein.   Exercise:  For substantial health benefits, adults should do at least 150 minutes (2 hours and 30 minutes) a week of moderate-intensity, or 75 minutes (1 hour and 15 minutes) a week of vigorous-intensity aerobic physical activity, or an equivalent combination of moderate- and vigorous-intensity aerobic activity. Aerobic activity should be performed in episodes of at least 10 minutes, and preferably, it should be spread throughout the week.  Behavior Modification:  We discussed the following Behavioral Modification Strategies today: increasing lean protein intake, decreasing simple carbohydrates, increasing vegetables, meal planning and cooking strategies, keeping healthy foods in the home, and planning for success.   Return in about 4 weeks (around 08/03/2023).   She was informed of the importance of frequent follow up visits to maximize her success with intensive lifestyle modifications for her multiple health conditions.  Attestation Statements:  Reviewed by clinician on day of visit: allergies, medications, problem list, medical history, surgical history, family history, social history, and previous encounter notes.     Adelita Cho, MD

## 2023-07-06 NOTE — Assessment & Plan Note (Signed)
 Patient is intermittently checking her blood sugars and they are around 120s- these are not fasting numbers.  She is on farxiga and metformin.  No recent changes in her meds and no refills needed.

## 2023-07-07 ENCOUNTER — Other Ambulatory Visit (INDEPENDENT_AMBULATORY_CARE_PROVIDER_SITE_OTHER): Payer: Self-pay | Admitting: Family Medicine

## 2023-07-07 DIAGNOSIS — E1159 Type 2 diabetes mellitus with other circulatory complications: Secondary | ICD-10-CM

## 2023-07-27 ENCOUNTER — Ambulatory Visit (INDEPENDENT_AMBULATORY_CARE_PROVIDER_SITE_OTHER): Admitting: Family Medicine

## 2023-07-28 ENCOUNTER — Other Ambulatory Visit (INDEPENDENT_AMBULATORY_CARE_PROVIDER_SITE_OTHER): Payer: Self-pay | Admitting: Family Medicine

## 2023-07-28 DIAGNOSIS — E559 Vitamin D deficiency, unspecified: Secondary | ICD-10-CM

## 2023-08-02 ENCOUNTER — Encounter (INDEPENDENT_AMBULATORY_CARE_PROVIDER_SITE_OTHER): Payer: Self-pay | Admitting: Family Medicine

## 2023-08-02 ENCOUNTER — Ambulatory Visit (INDEPENDENT_AMBULATORY_CARE_PROVIDER_SITE_OTHER): Admitting: Family Medicine

## 2023-08-02 VITALS — BP 111/78 | HR 87 | Temp 98.0°F | Ht 65.0 in | Wt 275.0 lb

## 2023-08-02 DIAGNOSIS — F419 Anxiety disorder, unspecified: Secondary | ICD-10-CM | POA: Diagnosis not present

## 2023-08-02 DIAGNOSIS — Z6841 Body Mass Index (BMI) 40.0 and over, adult: Secondary | ICD-10-CM

## 2023-08-02 DIAGNOSIS — E1165 Type 2 diabetes mellitus with hyperglycemia: Secondary | ICD-10-CM

## 2023-08-02 DIAGNOSIS — F32A Depression, unspecified: Secondary | ICD-10-CM

## 2023-08-02 DIAGNOSIS — Z7984 Long term (current) use of oral hypoglycemic drugs: Secondary | ICD-10-CM | POA: Diagnosis not present

## 2023-08-02 DIAGNOSIS — E559 Vitamin D deficiency, unspecified: Secondary | ICD-10-CM | POA: Diagnosis not present

## 2023-08-02 DIAGNOSIS — E669 Obesity, unspecified: Secondary | ICD-10-CM

## 2023-08-02 MED ORDER — VITAMIN D (ERGOCALCIFEROL) 1.25 MG (50000 UNIT) PO CAPS: 50000.0000 [IU] | ORAL_CAPSULE | ORAL | 0 refills | Status: AC

## 2023-08-02 MED ORDER — FLUOXETINE HCL 20 MG PO CAPS: 20.0000 mg | ORAL_CAPSULE | Freq: Every day | ORAL | 0 refills | Status: AC

## 2023-08-02 MED ORDER — VRAYLAR 1.5 MG PO CAPS: 1.5000 mg | ORAL_CAPSULE | Freq: Every day | ORAL | 0 refills | Status: DC

## 2023-08-02 NOTE — Progress Notes (Unsigned)
   SUBJECTIVE:  Chief Complaint: Obesity  Interim History: Patient mentions she has had a trying a month.  She is trying to get back on food stamps but hasn't heard anything from anyone.  She is still going to food pantries but mentions they are very heavy on bread, potatoes and desserts.  She is trying to implement portion control even though its not the best for her to eat. No upcoming events or activities.  She is checking in with her therapist every week.   Natasha Lopez is here to discuss her progress with her obesity treatment plan. She is on the keeping a food journal and adhering to recommended goals of 1450-1600 calories and 95 grams of protein, but has been following portion control and smarter choices and states she is following her eating plan approximately 50 % of the time. She states she is exercising very little.   OBJECTIVE: Visit Diagnoses: Problem List Items Addressed This Visit   None   Vitals Temp: 98 F (36.7 C) BP: 111/78 Pulse Rate: 87 SpO2: 97 %   Anthropometric Measurements Height: 5' 5 (1.651 m) Weight: 275 lb (124.7 kg) BMI (Calculated): 45.76 Weight at Last Visit: 273 lb Weight Lost Since Last Visit: 2 Weight Gained Since Last Visit: 0 Starting Weight: 315 lb Total Weight Loss (lbs): 40 lb (18.1 kg)   Body Composition  Body Fat %: 47.7 % Fat Mass (lbs): 131.4 lbs Muscle Mass (lbs): 137 lbs Total Body Water (lbs): 98.4 lbs Visceral Fat Rating : 16   Other Clinical Data Fasting: yes Labs: no Today's Visit #: 11 Starting Date: 11/18/22 Comments: 1450-1600/95  pc/[place]     ASSESSMENT AND PLAN:  Diet: Michaella is currently in the action stage of change. As such, her goal is to continue with weight loss efforts and has agreed to practicing portion control and making smarter food choices, such as increasing vegetables and decreasing simple carbohydrates.   Exercise:  For substantial health benefits, adults should do at least 150 minutes (2 hours and  30 minutes) a week of moderate-intensity, or 75 minutes (1 hour and 15 minutes) a week of vigorous-intensity aerobic physical activity, or an equivalent combination of moderate- and vigorous-intensity aerobic activity. Aerobic activity should be performed in episodes of at least 10 minutes, and preferably, it should be spread throughout the week.  She wants to get more consistent with walking but mentions she fell out of the habit of doing this so she is trying to reimplement that   Behavior Modification:  We discussed the following Behavioral Modification Strategies today: increasing lean protein intake, decreasing simple carbohydrates, increasing vegetables, meal planning and cooking strategies, and planning for success. We discussed various medication options to help [place]e with her weight loss efforts and we both agreed to ***.  No follow-ups on file.   She was informed of the importance of frequent follow up visits to maximize her success with intensive lifestyle modifications for her multiple health conditions.  Attestation Statements:   Reviewed by clinician on day of visit: allergies, medications, problem list, medical history, surgical history, family history, social history, and previous encounter notes.   Time spent on visit including pre-visit chart review and post-visit care and charting was *** minutes  Adelita Cho, MD

## 2023-08-04 NOTE — Assessment & Plan Note (Signed)
 Anthropometric Measurements Height: 5' 5 (1.651 m) Weight: 275 lb (124.7 kg) BMI (Calculated): 45.76 Weight at Last Visit: 273 lb Weight Lost Since Last Visit: 2 Weight Gained Since Last Visit: 0 Starting Weight: 315 lb Total Weight Loss (lbs): 40 lb (18.1 kg) Body Composition  Body Fat %: 47.7 % Fat Mass (lbs): 131.4 lbs Muscle Mass (lbs): 137 lbs Total Body Water (lbs): 98.4 lbs Visceral Fat Rating : 16 Other Clinical Data Fasting: yes Labs: no Today's Visit #: 11 Starting Date: 11/18/22 Comments: 1450-1600/95  pc/[place]

## 2023-08-04 NOTE — Assessment & Plan Note (Signed)
 On prescription strength Vitamin D  with no nausea, vomiting or muscle weakness.  She needs a refill today.  Will re-evaluate management at time of next lab draw.

## 2023-08-04 NOTE — Assessment & Plan Note (Signed)
 Patient has reached out to psych provider for medication refills but has not heard anything.  She has been without prozac  for 3 days.  Will refill prozac  and vraylar  to ensure continuity.  No suicidal or homicidal ideation.

## 2023-08-04 NOTE — Assessment & Plan Note (Signed)
 Patient mentions food pantries currently only have higher carb options that limits her from adequate nutritional intake.  Will refer to [facility]rito program for assistance of obtaining nutritious food to help with blood sugar control

## 2023-09-14 ENCOUNTER — Ambulatory Visit (INDEPENDENT_AMBULATORY_CARE_PROVIDER_SITE_OTHER): Admitting: Family Medicine

## 2023-09-14 ENCOUNTER — Encounter (INDEPENDENT_AMBULATORY_CARE_PROVIDER_SITE_OTHER): Payer: Self-pay | Admitting: Family Medicine

## 2023-09-14 VITALS — BP 105/72 | HR 87 | Temp 98.1°F | Ht 65.0 in | Wt 277.0 lb

## 2023-09-14 DIAGNOSIS — F32A Depression, unspecified: Secondary | ICD-10-CM

## 2023-09-14 DIAGNOSIS — Z7984 Long term (current) use of oral hypoglycemic drugs: Secondary | ICD-10-CM | POA: Diagnosis not present

## 2023-09-14 DIAGNOSIS — Z6841 Body Mass Index (BMI) 40.0 and over, adult: Secondary | ICD-10-CM

## 2023-09-14 DIAGNOSIS — E785 Hyperlipidemia, unspecified: Secondary | ICD-10-CM | POA: Diagnosis not present

## 2023-09-14 DIAGNOSIS — F419 Anxiety disorder, unspecified: Secondary | ICD-10-CM

## 2023-09-14 DIAGNOSIS — E1165 Type 2 diabetes mellitus with hyperglycemia: Secondary | ICD-10-CM | POA: Diagnosis not present

## 2023-09-14 DIAGNOSIS — E669 Obesity, unspecified: Secondary | ICD-10-CM

## 2023-09-14 DIAGNOSIS — E1169 Type 2 diabetes mellitus with other specified complication: Secondary | ICD-10-CM | POA: Diagnosis not present

## 2023-09-14 DIAGNOSIS — E559 Vitamin D deficiency, unspecified: Secondary | ICD-10-CM | POA: Diagnosis not present

## 2023-09-14 MED ORDER — VRAYLAR 1.5 MG PO CAPS: 1.5000 mg | ORAL_CAPSULE | Freq: Every day | ORAL | 0 refills | Status: AC

## 2023-09-14 NOTE — Progress Notes (Unsigned)
 SUBJECTIVE:  Chief Complaint: Obesity  Interim History: patient reports that she and her family finally got approved for food stamps.  She mentions prior to this she has been just trying to eat what she can eat when she can eat.  Both of her daughters are working now- one at [employer] and one at [employer].  Most of her medications are free so she has been able to stay on all of those.  Her husband is coming into town for the weekend and that's about it.   Natasha Lopez is here to discuss her progress with her obesity treatment plan. She is on the practicing portion control and making smarter food choices, such as increasing vegetables and decreasing simple carbohydrates and states she is following her eating plan approximately 40 % of the time. She states she is walking 45 minutes 5 times for 2 weeks.   OBJECTIVE: Visit Diagnoses: Problem List Items Addressed This Visit       Endocrine   Type 2 diabetes mellitus with hyperglycemia (HCC)   Relevant Orders   Comprehensive metabolic panel with GFR   Hemoglobin A1c   Insulin , random   Hyperlipidemia associated with type 2 diabetes mellitus (HCC)   Relevant Orders   Lipid Panel With LDL/HDL Ratio     Other   Morbid obesity (HCC)   Vitamin D  deficiency - Primary   Relevant Orders   VITAMIN D  25 Hydroxy (Vit-D Deficiency, Fractures)   Anxiety and depression   Patient has a nurse practitioner that was managing medication doses and options.  She mentions that she has not had a follow up in quite some time as she has been stable on her medications.  Doing relatively well on prozac  and vraylar .  Needs a refill of vraylar  today.      Relevant Medications   VRAYLAR  1.5 MG capsule   Other Visit Diagnoses       BMI 45.0-49.9, adult (HCC)           Vitals Temp: 98.1 F (36.7 C) BP: 105/72 Pulse Rate: 87 SpO2: 98 %   Anthropometric Measurements Height: 5' 5 (1.651 m) Weight: 277 lb (125.6 kg) BMI (Calculated): 46.1 Weight at Last Visit:  275 lb Weight Lost Since Last Visit: 0 Weight Gained Since Last Visit: 2 Starting Weight: 315 lb Total Weight Loss (lbs): 38 lb (17.2 kg)   Body Composition  Body Fat %: 48 % Fat Mass (lbs): 133 lbs Muscle Mass (lbs): 136.8 lbs Total Body Water (lbs): 98.6 lbs Visceral Fat Rating : 16   Other Clinical Data Fasting: yes Labs: yes Today's Visit #: 12 Starting Date: 11/18/22 Comments: PC/[place]     ASSESSMENT AND PLAN: Assessment & Plan Vitamin D  deficiency Patient reports still significant fatigue though she has been on a higher carbohydrate dietary intake due to relying on food pantries.  Repeat vitamin D  level today to assess response to prescription strength supplementation. Type 2 diabetes mellitus with hyperglycemia, without long-term current use of insulin  [facility]) Patient has been working on dietary changes but has been limited in her implementation of a higher protein due to financial hardships.  She just recently requalify for food stamps.  Hoping to get more protein rich foods in her home and limit her simple carbohydrate intake.  Repeat CMP, A1c, and insulin  level today. Hyperlipidemia associated with type 2 diabetes mellitus (HCC) Patient is on pravastatin 20 mg daily.  She denies any myalgias associated with statin treatment.  Needs a repeat fasting lipid panel today  and will risk stratify using ASCVD calculator at next appointment with new lab results. Anxiety and depression Patient has a nurse practitioner that was managing medication doses and options.  She mentions that she has not had a follow up in quite some time as she has been stable on her medications.  Doing relatively well on prozac  and vraylar .  Needs a refill of vraylar  today. Obesity with starting BMI of 52.5  BMI 45.0-49.9, adult (HCC)    Diet: Vlasta is currently in the action stage of change. As such, her goal is to continue with weight loss efforts and has agreed to keeping a food journal and adhering to  recommended goals of 1650-1800 calories and 125 or more grams of protein.   Exercise:  For substantial health benefits, adults should do at least 150 minutes (2 hours and 30 minutes) a week of moderate-intensity, or 75 minutes (1 hour and 15 minutes) a week of vigorous-intensity aerobic physical activity, or an equivalent combination of moderate- and vigorous-intensity aerobic activity. Aerobic activity should be performed in episodes of at least 10 minutes, and preferably, it should be spread throughout the week.  Behavior Modification:  We discussed the following Behavioral Modification Strategies today: increasing lean protein intake, decreasing simple carbohydrates, increasing vegetables, meal planning and cooking strategies, planning for success, and keep a strict food journal.   Return in about 6 weeks (around 10/26/2023).   She was informed of the importance of frequent follow up visits to maximize her success with intensive lifestyle modifications for her multiple health conditions.  Attestation Statements:   Reviewed by clinician on day of visit: allergies, medications, problem list, medical history, surgical history, family history, social history, and previous encounter notes.     Natasha Cho, MD

## 2023-09-14 NOTE — Assessment & Plan Note (Addendum)
 Patient has a Publishing rights manager that was managing medication doses and options.  She mentions that she has not had a follow up in quite some time as she has been stable on her medications.  Doing relatively well on prozac  and vraylar .  Needs a refill of vraylar  today.

## 2023-09-15 LAB — COMPREHENSIVE METABOLIC PANEL WITH GFR
ALT: 18 IU/L (ref 0–32)
AST: 13 IU/L (ref 0–40)
Albumin: 4.1 g/dL (ref 3.9–4.9)
Alkaline Phosphatase: 77 IU/L (ref 44–121)
BUN/Creatinine Ratio: 11 (ref 9–23)
BUN: 11 mg/dL (ref 6–24)
Bilirubin Total: 0.4 mg/dL (ref 0.0–1.2)
CO2: 22 mmol/L (ref 20–29)
Calcium: 9.6 mg/dL (ref 8.7–10.2)
Chloride: 103 mmol/L (ref 96–106)
Creatinine, Ser: 1.01 mg/dL — ABNORMAL HIGH (ref 0.57–1.00)
Globulin, Total: 2.7 g/dL (ref 1.5–4.5)
Glucose: 96 mg/dL (ref 70–99)
Potassium: 4.5 mmol/L (ref 3.5–5.2)
Sodium: 142 mmol/L (ref 134–144)
Total Protein: 6.8 g/dL (ref 6.0–8.5)
eGFR: 69 mL/min/1.73 (ref >59)

## 2023-09-15 LAB — LIPID PANEL WITH LDL/HDL RATIO
Cholesterol, Total: 146 mg/dL (ref 100–199)
HDL: 35 mg/dL — ABNORMAL LOW (ref >39)
LDL Chol Calc (NIH): 94 mg/dL (ref 0–99)
LDL/HDL Ratio: 2.7 ratio (ref 0.0–3.2)
Triglycerides: 91 mg/dL (ref 0–149)
VLDL Cholesterol Cal: 17 mg/dL (ref 5–40)

## 2023-09-15 LAB — INSULIN, RANDOM: INSULIN: 23.6 u[IU]/mL (ref 2.6–24.9)

## 2023-09-15 LAB — VITAMIN D 25 HYDROXY (VIT D DEFICIENCY, FRACTURES): Vit D, 25-Hydroxy: 41.2 ng/mL (ref 30.0–100.0)

## 2023-09-15 LAB — HEMOGLOBIN A1C
Est. average glucose Bld gHb Est-mCnc: 120 mg/dL
Hgb A1c MFr Bld: 5.8 % — ABNORMAL HIGH (ref 4.8–5.6)

## 2023-09-16 NOTE — Assessment & Plan Note (Signed)
 Patient is on pravastatin 20 mg daily.  She denies any myalgias associated with statin treatment.  Needs a repeat fasting lipid panel today and will risk stratify using ASCVD calculator at next appointment with new lab results.

## 2023-09-16 NOTE — Assessment & Plan Note (Signed)
 Patient has been working on dietary changes but has been limited in her implementation of a higher protein due to financial hardships.  She just recently requalify for food stamps.  Hoping to get more protein rich foods in her home and limit her simple carbohydrate intake.  Repeat CMP, A1c, and insulin  level today.

## 2023-09-16 NOTE — Assessment & Plan Note (Signed)
 Patient reports still significant fatigue though she has been on a higher carbohydrate dietary intake due to relying on food pantries.  Repeat vitamin D  level today to assess response to prescription strength supplementation.

## 2023-10-05 ENCOUNTER — Other Ambulatory Visit (INDEPENDENT_AMBULATORY_CARE_PROVIDER_SITE_OTHER): Payer: Self-pay | Admitting: Family Medicine

## 2023-10-05 DIAGNOSIS — I152 Hypertension secondary to endocrine disorders: Secondary | ICD-10-CM

## 2023-10-11 ENCOUNTER — Other Ambulatory Visit (INDEPENDENT_AMBULATORY_CARE_PROVIDER_SITE_OTHER): Payer: Self-pay | Admitting: Family Medicine

## 2023-10-11 DIAGNOSIS — I152 Hypertension secondary to endocrine disorders: Secondary | ICD-10-CM

## 2023-10-26 ENCOUNTER — Ambulatory Visit (INDEPENDENT_AMBULATORY_CARE_PROVIDER_SITE_OTHER): Admitting: Family Medicine

## 2023-11-03 ENCOUNTER — Other Ambulatory Visit (INDEPENDENT_AMBULATORY_CARE_PROVIDER_SITE_OTHER): Payer: Self-pay | Admitting: Family Medicine

## 2023-11-03 DIAGNOSIS — F419 Anxiety disorder, unspecified: Secondary | ICD-10-CM

## 2023-11-15 ENCOUNTER — Other Ambulatory Visit (INDEPENDENT_AMBULATORY_CARE_PROVIDER_SITE_OTHER): Payer: Self-pay | Admitting: Family Medicine

## 2023-11-15 ENCOUNTER — Encounter (INDEPENDENT_AMBULATORY_CARE_PROVIDER_SITE_OTHER): Payer: Self-pay

## 2023-11-15 DIAGNOSIS — F32A Depression, unspecified: Secondary | ICD-10-CM

## 2023-11-16 ENCOUNTER — Other Ambulatory Visit (INDEPENDENT_AMBULATORY_CARE_PROVIDER_SITE_OTHER): Payer: Self-pay | Admitting: Family Medicine

## 2023-11-16 DIAGNOSIS — F32A Depression, unspecified: Secondary | ICD-10-CM

## 2023-12-19 ENCOUNTER — Other Ambulatory Visit (INDEPENDENT_AMBULATORY_CARE_PROVIDER_SITE_OTHER): Payer: Self-pay | Admitting: Family Medicine

## 2023-12-19 DIAGNOSIS — F419 Anxiety disorder, unspecified: Secondary | ICD-10-CM

## 2023-12-19 DIAGNOSIS — E559 Vitamin D deficiency, unspecified: Secondary | ICD-10-CM
